# Patient Record
Sex: Female | Born: 1970
Health system: Western US, Academic
[De-identification: ages and names within clinical notes are randomized; demographics above are authoritative.]

## PROBLEM LIST (undated history)

## (undated) MED ORDER — SODIUM CHLORIDE FLUSH 0.9 % IV SOLN
10.0000 mL | Freq: Once | INTRAVENOUS | Status: AC
Start: 2019-09-29 — End: 2019-09-29

## (undated) MED ORDER — ACETAMINOPHEN 500 MG OR TABS
500.0000 mg | ORAL_TABLET | Freq: Once | ORAL | Status: AC
Start: 2019-09-18 — End: 2019-09-18

## (undated) MED ORDER — ATROPINE SULFATE 1 MG/10ML IJ SOSY
1.0000 mg | PREFILLED_SYRINGE | Freq: Once | INTRAMUSCULAR | Status: AC | PRN
Start: 2019-09-28 — End: 2020-03-26

## (undated) MED ORDER — DOBUTAMINE IN D5W 1 MG/ML IV SOLN
10.0000 ug/kg/min | Freq: Once | INTRAVENOUS | Status: AC | PRN
Start: 2019-09-28 — End: 2020-03-26

## (undated) MED ORDER — SODIUM CHLORIDE 0.9 % IV SOLN
2000.0000 mg | Freq: Once | INTRAVENOUS | Status: AC
Start: 2019-10-02 — End: 2019-10-02

## (undated) MED ORDER — SODIUM CHLORIDE FLUSH 0.9 % IV SOLN
10.0000 mL | Freq: Once | INTRAVENOUS | Status: AC
Start: 2019-09-28 — End: 2019-09-28

## (undated) MED ORDER — PLASMA-LYTE A IV SOLN
10.0000 mL/h | INTRAVENOUS | Status: AC
Start: 2019-09-18 — End: ?

## (undated) MED ORDER — POVIDONE-IODINE 10 % EX SWAB
1.0000 | Freq: Two times a day (BID) | CUTANEOUS | Status: AC
Start: 2019-08-13 — End: 2019-08-18

## (undated) MED ORDER — DEXAMETHASONE SODIUM PHOSPHATE 4 MG/ML IJ SOLN (CUSTOM)
4.0000 mg | Freq: Four times a day (QID) | INTRAMUSCULAR | Status: AC
Start: 2019-10-02 — End: ?

## (undated) MED ORDER — DEXTROSE-NACL 5-0.9 % IV SOLN (CUSTOM)
INTRAVENOUS | Status: AC
Start: 2019-08-13 — End: ?

## (undated) MED ORDER — PERFLUTREN LIPID MICROSPHERE 1.1 MG/ML IV SUSP
0.4000 mL | Freq: Once | INTRAVENOUS | Status: AC
Start: 2019-09-28 — End: 2019-09-28

---

## 2018-09-13 ENCOUNTER — Inpatient Hospital Stay: Admit: 2018-09-13 | Discharge: 2018-09-13 | Disposition: A | Discharge status: Home Health | Payer: Self-pay

## 2018-09-16 ENCOUNTER — Inpatient Hospital Stay: Admit: 2018-09-16 | Discharge: 2018-09-16 | Disposition: A | Discharge status: Home Health | Payer: Self-pay

## 2019-02-18 ENCOUNTER — Inpatient Hospital Stay: Admit: 2019-02-18 | Discharge: 2019-02-18 | Disposition: A | Discharge status: Home Health | Payer: Self-pay

## 2019-02-28 ENCOUNTER — Inpatient Hospital Stay: Admit: 2019-02-28 | Discharge: 2019-02-28 | Disposition: A | Discharge status: Home Health | Payer: Self-pay

## 2019-04-28 ENCOUNTER — Telehealth: Payer: PRIVATE HEALTH INSURANCE

## 2019-04-28 NOTE — Telephone Encounter
Tried calling pt re her appt with Dr. Earnest Bailey tomorrow; unable to leave vm. Wanted to make sure pt will bring in MRI CDs into appt and give reminder call

## 2019-04-29 ENCOUNTER — Inpatient Hospital Stay: Payer: PRIVATE HEALTH INSURANCE

## 2019-04-29 ENCOUNTER — Ambulatory Visit: Payer: PRIVATE HEALTH INSURANCE

## 2019-04-29 DIAGNOSIS — Z719 Counseling, unspecified: Secondary | ICD-10-CM

## 2019-04-29 DIAGNOSIS — G950 Syringomyelia and syringobulbia: Secondary | ICD-10-CM

## 2019-04-29 DIAGNOSIS — G935 Compression of brain: Secondary | ICD-10-CM

## 2019-04-29 NOTE — H&P
TITLE:   NEUROSURGICAL OUTPATIENT HISTORY AND PHYSICAL   DATE OF SERVICE:   04/29/2019   Referring Practitioner: Leana Roe., MD    ATTENDING PHYSICIAN:   Wynn Banker, MD     HISTORY OF PRESENT ILLNESS:   This is a 48 y.o.-year-old female with a history of craniectomy, partial C1 laminectomy, suboccipital, decompression on 09/01/15 at OSH for Chiari Malformation with syringomyelia.    Today the patient reports left hand numbness/tingling with weakness and left palm atrophy. She also reports left sided low back pain that extends into her left lateral thigh, calf, and into the bottom of her left foot with left leg weakness. Additionally, she reports all over body tingling. She further denies headaches, visual changes, difficulty swallowing, symptoms in the right upper or right lower extremity. She states her symptoms began in 2018 with no precipitating event. She states her symptoms are achy in nature which is better in the morning and becomes progressively worse throughout the day. She also reports improvement in symptoms when lying down and raising her feet. She endorses problems with fine motor control in the left hand (new difficulty using chopsticks), but denies problems with fine motor control in the right, difficulty with ambulation or bladder/bowel dysfunction. She takes gabapentin without significant relief. She has attempted physical therapy for 3 months earlier this year without significant relief. She additional reports attempted ESIx3 for her low back without significant relief (she is unsure of when these took place).     She was seen by her neurosurgery team in [place] who referred the patient to [facility] for second opinion regarding possible syringoperitoneal or syringoarachnoid shunt placement.    PAST MEDICAL HISTORY:   History reviewed. No pertinent past medical history.   PAST SURGICAL HISTORY:   Past Surgical History:   Procedure Laterality Date   ? head surgery        SOCIAL HISTORY: reports that she has never smoked. She has never used smokeless tobacco. She reports that she does not drink alcohol. No history on file for drug.   FAMILY HISTORY:   History reviewed. No pertinent family history.   CURRENT MEDICATIONS:   Medications that the patient states to be currently taking   Medication Sig   ? gabapentin 300 mg capsule       ALLERGIES:   Patient has no known allergies.   REVIEW OF SYSTEMS:   Fourteen systems are reviewed and noted in the patient's chart.   PHYSICAL EXAMINATION:   Vital Signs: BP 103/66  ~ Pulse 75  ~ Temp 36.2 ?C (97.1 ?F) (Temporal)  ~ Ht 5' 2'' (1.575 m)  ~ Wt 120 lb (54.4 kg)  ~ BMI 21.95 kg/m?   Neurologic Exam: Cranial nerves II through XII are intact.   Motor strength:  Motor D B T WE WF IO G   R 5 5 5 5 5 5 5    L 5 5 5 5 5  4+ 5     Motor HF KE KF DF PF EHL   R 5 5 5 5 5  4+   L 5 5 5 5 5 5      Sensory exam: intact and symmetric to light touch bilaterally with exception to decrease sensation in the left medial foot.     Deep tendon reflexes:     BR B T P A Hoffmann's Babinski   R 2 2 2  3+ 2 negative down   L 2 2 2  3+ 2 negative down   +suprapatellar  reflex bilaterally  No ankle clonus. Coordination is intact to rapid alternating finger movements. Gait is normal with no ataxia.     DIAGNOSTIC DATA:   MRI cervical spine:      MRI thoracic spine:      MRI lumbar spine:        ASSESSMENT:   This is a 48 y.o.-year-old female with a history of craniectomy, partial C1 laminectomy, suboccipital, decompression on 09/01/15 at OSH for Chiari Malformation with syringomyelia who presents today with left hand numbness/tingling with weakness and left palm atrophy. She also reports left sided low back pain that extends into her left lateral thigh, calf, and into the bottom of her left foot with left leg weakness. Additionally, she reports all over body tingling. Although in the absence of headaches, her imaging demonstrates persistent crowding at the cervicomedullary junction despite prior Chiari decompression which can be contributing to persistent syringomyelia. Given this, we recommend revision suboccipital craniectomy, C1 laminectomy (previously performed partial C1), shrinkage of cerebellar tonsils, and pericranial duraplasty but will require further work up of MRI brain without contrast to rule out brain pathology.     PLAN:   -MRI brain without contrast to further evaluate Chiari Malformation and rule out brain pathology  -We recommend revision suboccipital craniectomy, C1 laminectomy (previously performed partial C1), shrinkage of cerebellar tonsils, and pericranial duraplasty. Intraoperative and postoperative risks and benefits were explained to the patient in great detail including infection, bleeding, epidural or intradural hematoma, blood vessel injury causing stroke, imbalance, dysphagia or Horner's syndrome, failure to achieve the goal of the surgery, incomplete decompression, intraoperative or delayed CSF leak, spinal cord or nerve injury causing weakness, paralysis or neuropathic pain, chemical meningitis, pseudomeningocele, recurrent ectopia, and need for additional future surgery. Patient agreed to the discussed risks and would like to proceed with surgery.    I spent 60 minutes with this patient. More than 50 percent of this time was spent discussing the diagnosis and the treatment plan as well as counseling the patient. All questions were answered and Lauren Watson understood and was satisfied with this plan.   The patient was seen, examined, and plan formulated with Dr. Wynn Banker.   Benson Norway NP

## 2019-04-29 NOTE — Telephone Encounter
I spoke with pt regarding appointment with Dr. Earnest Bailey on Tuesday 04/29/19. I provided check in time, address, and parking info. I reminded pt to bring MRI disks for MRI in august and MRIs in February. She confirmed

## 2019-04-29 NOTE — Patient Instructions
Marland KitchenPLEASE REVIEW INFORMATION BELOW:     ??? For all visits, please be ready to show your photo ID and insurance card(s), and pay any applicable co-pays that will be due at the time of visit.     ??? It is your responsibility to be sure that your health insurance plan covers any lab test, radiology procedure, consultation, orthopaedic procedures which include, but not limit, injections, casting, bracing, durable medical equipment, etc. which has been recommended for you.  Contacting the Office:  ??? Each physician office has a Patient Care Coordinator that will help coordinate your care. They can help with:  ??? General questions  ??? Relay specific questions to the doctor   ??? Schedule surgery  ??? Follow up on authorizations that are pending  ??? Provide paperwork such as a return to work/school letter or disability forms  ??? Please allow 24 hours for messages to be returned from the physician???s office.  ??? There is a 7-10 business day turnaround for all forms.  ??? We encourage you to sign up for [facility] to communicate directly with the office or physician online. Please ask any staff member for additional information.   Following Up with the Physician:  ??? Before leaving, physician will let you know if you will need to follow-up within a certain time. If yes, make sure you schedule your follow-up appointment at the front desk or by calling the centralized scheduling center at:  ???  [phone] Orthopaedic Surgery.  ???  [phone] [facility].  ???  [phone] Orthopaedic/[facility].  ??? Did your physician order any labs, imaging, or tests that should be completed before your next follow-up? If yes, please continue reading below.  Physician Order/Pre-authorization:  ??? For any physician orders/pre-authorizations, please allow 5-7 business days for authorization to be obtained.  Imaging (MRI, X-Ray, CT Scan, etc)  ??? If you are scheduling at a [facility], please call Radiology at [phone] to schedule your imaging. You do not need to wait for an authorization to schedule.   ??? If you are scheduling outside of [place]:  ??? Notify your physician???s office of your location choice, this will help in obtaining authorization for the correct facility.  ??? Once you complete the imaging, obtain a copy of the report and images. Provide a copy to your physician for review.  ??? Schedule a follow-up appointment to review the images, unless stated otherwise by your physician or the office.  Injections  ??? If the physician recommends an injection, plan anywhere from 1-5 follow-up visits with your physician in a specific timeframe to complete the treatment plan. The follow-up visits can be as frequent as once a week until all the dosage has been administered.  ??? If authorization is required, allow 5-7 business days for the office to obtain authorization.  Lab work  ??? If labs are ordered, keep in mind:  ??? Fasting or non-fasting?  ??? How soon must they be done? (as soon as possible, one week before next appointment, a month before next appointment, etc.)  ??? Do you need to schedule a follow-up visit to discuss results or did the physician say they will call you with the results?   ??? If lab work is completed at a [facility], your results will automatically be sent to the physician, however allow a few days for results to be completed. Some labs require more time than others for results to finalize.  ??? If lab work is completed outside  a [facility], bring a copy of the lab orders with you. After a few days, follow-up with the lab to obtain a copy of your lab results for your physician.  Physical Therapy  ??? If physical therapy is recommended, notify the office of where you would prefer to seek therapy.  ??? Before doing so, verify with the physical therapy location that your insurance will be accepted.  ??? If authorization is required, allow 5-7 business days for the office to obtain authorization. ??? If physical therapy is completed at a [facility] facility, the progress reports will automatically be sent to the physician.  ??? Physical therapy requires multiple visits to your chosen facility, anywhere from 2 weeks and more depending on the nature of your case.  Nerve Conduction/EMG Study  ??? If the physician ordered a Nerve Conduction or EMG Study, contact the Refer to Physician Office to schedule your appointment.

## 2019-05-01 ENCOUNTER — Telehealth: Payer: PRIVATE HEALTH INSURANCE

## 2019-05-01 NOTE — Telephone Encounter
Called pt to discuss recommended surgery. She told me that she's busy working and will call me back.

## 2019-05-02 ENCOUNTER — Telehealth: Payer: PRIVATE HEALTH INSURANCE

## 2019-05-02 NOTE — Telephone Encounter
Pt called lmvm asking me to call her back, I called her, but got vm.

## 2019-05-03 NOTE — Telephone Encounter
Spoke to pt re surgery, we discussed oct dates, but after speaking to her family and pcp, she would like mid nov, I emailed dr Earnest Bailey to check dates.     Emailed pt all surgery info, she is aware she will need a new mri.

## 2019-05-05 ENCOUNTER — Inpatient Hospital Stay: Payer: PRIVATE HEALTH INSURANCE

## 2019-05-05 ENCOUNTER — Telehealth: Payer: PRIVATE HEALTH INSURANCE

## 2019-05-05 ENCOUNTER — Ambulatory Visit: Payer: PRIVATE HEALTH INSURANCE

## 2019-05-05 DIAGNOSIS — G935 Compression of brain: Secondary | ICD-10-CM

## 2019-05-05 NOTE — Telephone Encounter
Emailed pt the MRI info

## 2019-05-05 NOTE — Telephone Encounter
Spoke to pt and her friend, she confirmed surgery for 06/02/19, emailed info, she decided that she would like to have the MRI at [facility].

## 2019-05-06 ENCOUNTER — Ambulatory Visit: Payer: PRIVATE HEALTH INSURANCE

## 2019-05-06 ENCOUNTER — Telehealth: Payer: PRIVATE HEALTH INSURANCE

## 2019-05-06 DIAGNOSIS — Z01818 Encounter for other preprocedural examination: Secondary | ICD-10-CM

## 2019-05-06 NOTE — Telephone Encounter
Faxed pcp office the pre-op form, called their office as well

## 2019-05-06 NOTE — Telephone Encounter
Spoke to [place] at pt's PCP office, faxed her an h/p form for the pre-op.     She said the pt wants to have her MRI near her home, not [place]. I sent the pt another email requesting where she wants to have the MRI again.

## 2019-05-08 ENCOUNTER — Inpatient Hospital Stay: Admit: 2019-05-08 | Discharge: 2019-05-08 | Disposition: A | Discharge status: Home Health | Payer: Self-pay

## 2019-05-09 ENCOUNTER — Ambulatory Visit: Payer: PRIVATE HEALTH INSURANCE

## 2019-05-13 ENCOUNTER — Ambulatory Visit: Payer: PRIVATE HEALTH INSURANCE

## 2019-05-15 ENCOUNTER — Inpatient Hospital Stay: Payer: PRIVATE HEALTH INSURANCE

## 2019-05-15 DIAGNOSIS — R6889 Other general symptoms and signs: Secondary | ICD-10-CM

## 2019-05-22 ENCOUNTER — Telehealth: Payer: PRIVATE HEALTH INSURANCE

## 2019-05-22 ENCOUNTER — Ambulatory Visit: Payer: PRIVATE HEALTH INSURANCE

## 2019-05-22 NOTE — Telephone Encounter
Called pt re new orders from Dr. Earnest Bailey:    Please see if she can be seen by ophthalmology - history of Chairi surgery and mild hydrocephalus - rule out papilledema.  Also please order a brain MR venogram - rule out venous obstruction.  We need both of these things next week.     She asked me to email her this information. I've also asked Dr Earnest Bailey to clarify the contrast of the venogram.

## 2019-05-22 NOTE — Telephone Encounter
Called pcp office to request pre-op clearance, but they are closed, no vm.     Emailed pt asking her to please have clearance faxed.

## 2019-05-23 ENCOUNTER — Telehealth: Payer: PRIVATE HEALTH INSURANCE

## 2019-05-23 NOTE — Telephone Encounter
I called PCPs office @ [phone] re ophthalmology referral but got the operator because the office doesnt open until 9:30am today. I asked operator to pls send them a msg to call me back ASAP. I will also call them back @ 9:30am

## 2019-05-23 NOTE — Telephone Encounter
I spoke w/Dr. Leland Her office. She stated that they received auth via fax from Dr. Lamont Snowball office but not fax that I sent over.    I refaxed to them    Dr. Donnita Falls   Fax [phone]; tel: [phone]

## 2019-05-23 NOTE — Telephone Encounter
Faxed Dr. Ray Church clinic notes and ophthalmology auth from PCP to Dr. Mina Marble. Pt has appt w/ him on 11/3 @ 9:45am.    Dr. Donnita Falls   Fax [phone]; tel: [phone]  Address: [address], [place] [address]

## 2019-05-23 NOTE — Telephone Encounter
Spoke with Mardene Celeste in Dr. Jacki Cones office and scheduled ophthalmology appt for pt on  11/3 @ 9:45am. Spoke with pt and she confirm this appt.     Spoke w/ Tina @ PCP's office [phone]). She will submit urgent auth to ophthalmology.    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 She will call and schedule MR venogram with [facility]. I have already submitted an urgent auth. She understands that she must schedule ASAP because surgery is on 06/02/19

## 2019-05-26 ENCOUNTER — Inpatient Hospital Stay: Admit: 2019-05-26 | Discharge: 2019-05-26 | Disposition: A | Discharge status: Home Health | Payer: Self-pay

## 2019-05-26 ENCOUNTER — Telehealth: Payer: PRIVATE HEALTH INSURANCE

## 2019-05-26 NOTE — Telephone Encounter
Spoke w/ pt. She's scheduled to have venogram @ [facility] today @ 5pm. She will drop cd off tomorrow @ spine center. I emailed her spine center address and venogram order. Also faxed copy to [facility]. Also reminded her of her appt w/Dr. Mina Marble tomorrow

## 2019-05-26 NOTE — Telephone Encounter
Spoke w/ Alfonse Spruce in Dr. Leland Her office re pt's appt tomorrow with Dr. Mina Marble. She confirmed they received clinic notes form Dr.Holly and I explained to her why pt is coming and I told her to pls pass info on to Dr. Mina Marble. She confirmed she will do this and they will fax [place] Dr. Josefine Class clinic notes once she has been seen.       Dr. Donnita Falls   Fax [phone]; tel: [phone]

## 2019-05-27 ENCOUNTER — Telehealth: Payer: PRIVATE HEALTH INSURANCE

## 2019-05-27 NOTE — Telephone Encounter
Spoke with Dr. Annye Asa Dang's office @     Phone: [phone] - they are having pt come back tomorrow for a test, then will send results.

## 2019-05-28 ENCOUNTER — Telehealth: Payer: PRIVATE HEALTH INSURANCE

## 2019-05-28 NOTE — Telephone Encounter
Called ins company and pt re LOA.     He request is still pending.

## 2019-05-29 ENCOUNTER — Telehealth: Payer: PRIVATE HEALTH INSURANCE

## 2019-05-29 NOTE — Telephone Encounter
Ive spoken and emailed the pt, she is aware that her ins company will not approve an LOA and they have redirected her to [address]. She understands that we have cxled the surgery and all appointments.

## 2019-05-29 NOTE — Telephone Encounter
i've spoken with the pt, Lauren Watson and [name] in provider relations at her medical group many times today re the LOA request.     Per Butch Penny, she will not request an LOA for this pt, they will redirect the auth to [facility].     Notified pt.

## 2019-05-31 ENCOUNTER — Ambulatory Visit: Payer: PRIVATE HEALTH INSURANCE

## 2019-06-17 ENCOUNTER — Ambulatory Visit: Payer: PRIVATE HEALTH INSURANCE

## 2019-07-01 ENCOUNTER — Telehealth: Payer: PRIVATE HEALTH INSURANCE

## 2019-07-01 NOTE — Telephone Encounter
Received call from [facility] last Thursday. She was inquiring about obtaining records from Dr. [facility] office based on a referral he gave to [facility]. I had explained to her that the referral was not from Dr. Earnest Bailey and I would call pt's ins to further investigate because ins did not approve surgery with Dr. Earnest Bailey and re-directed her to [facility].     I tried calling pts family choice @ [phone], per Joana, however, no answer, unable to lmvm.     I called Di Kindle back @ [phone] and left a msg with her officemate, Blanch Media to have joanna pls call me back.     I have not heard back so I called back again, unable to leave a vm.

## 2019-07-01 NOTE — Telephone Encounter
Di Kindle from [place] returned my call. I reiterated that We didnt refer pt to [facility], her ins company re-directed her there. They are the ones assigning the doctor and referring her, not Dr. Earnest Bailey. She understood and said she will make note. I faxed pt's records to [facility] @ Fax #: [phone]    Tel: [phone]

## 2019-07-28 ENCOUNTER — Encounter: Payer: Self-pay | Admitting: Neurological Surgery

## 2019-07-28 ENCOUNTER — Ambulatory Visit: Payer: No Typology Code available for payment source | Attending: Neurological Surgery | Admitting: Neurological Surgery

## 2019-07-28 ENCOUNTER — Other Ambulatory Visit: Payer: Self-pay | Admitting: Neurological Surgery

## 2019-07-28 VITALS — BP 137/66 | HR 87 | Temp 97.9°F | Resp 18 | Ht 62.0 in | Wt 122.9 lb

## 2019-07-28 DIAGNOSIS — G935 Compression of brain: Secondary | ICD-10-CM | POA: Insufficient documentation

## 2019-07-28 DIAGNOSIS — R1900 Intra-abdominal and pelvic swelling, mass and lump, unspecified site: Secondary | ICD-10-CM | POA: Insufficient documentation

## 2019-07-28 DIAGNOSIS — Z6822 Body mass index (BMI) 22.0-22.9, adult: Secondary | ICD-10-CM | POA: Insufficient documentation

## 2019-07-28 NOTE — Progress Notes (Signed)
NEUROSURGERY NEW VISIT     Visit Type: New Visit    Requesting Provider: Ronda Fairly    Reason For Visit: had concerns including New Patient.    History Of Present Illness: 49 year old female with a past medical history of a chiari malformation s/p suboccipital decompression (2017; [facility]) presenting today to establish care for a subsequent suboccipital decompression with C1 laminectomy. The patient had been followed and worked up at [facility] for this procedure with Dr. Jeanice Lim until insurance stated that she needed to have the surgery performed at [facility]. 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 The patient's symptoms initially presented with complete weakness of her lower extremities in 2015 which identified her syrinx and chiari which occurred in [place]. She had improvement in her lower extremity and was able to regain her strength. In 2017, she had the suboccipital decompression performed which left her was some left leg weakness that improved over the course of a year with the use of a stationary bike. Currently, her symptoms include left plantar pain as well as generalized body numbness worse at night and better in the morning. In addition, she endorsees left handed weakness and intermittent headaches that are severe but resolve.The patient denies nausea, vomiting, and balance difficulties. Her prior surgeries include the suboccipital decompression.       [place] vietnamese interpreter [phone]) was used for the entirety of the visit as well as physical examination.    Allergies: No Known Allergies    Medications:   No current outpatient medications on file.     No current facility-administered medications for this visit.        Past Medical History:  has no past medical history on file.    Past Surgical History:  has no past surgical history on file.    Social History:  reports that she has never smoked. She has never used smokeless  tobacco.    Family History: family history is not on file.    Review of Systems:  A 14 point review of systems was conducted with pertinent positives included in the HPI, otherwise negative.    General: No acute distress  HEENT: Normocephalic, atraumatic  Neck: supple; suboccipital incision appears clean, dry and intact  CV: normal rate  Lungs: No labored respirations  Abdomen: Non-distended   Extremities: No deformity noted   Skin: No rashes or skin lesions   Back: No deformity noted, no point tenderness     Pupillometry:    Left Pupil:    Right Pupil:                      Awake, alert, oriented, speech fluent  PERRL, EOMI, facial sensation equal bilaterally, face symetric at rest and on excursion, hearing grossly intact, voice normal, shoulder shrug equal, tongue protrudes to midline   Shoulder Deltoid Biceps Triceps Grip Interossei HipFlex HipExt LegFlex LegExt DorsiFlex PlantarFlex   L 5 5 4+ 4+ 4 4 5 5 5 5 5 5    R 5 5 5 5 5 5 5 5 5 5 5 5    Sensation intact to light touch throughout  Reflexes 3+ bilaterally biceps, brachioradialis, patella;   No Hoffman; +Froment's sign (left)    IMAGING STUDIES: Films ordered and viewed today show a suboccipital decompression with C1 still intact. A syrinx is noted in the cervical, thoracic, and lumbar spine.      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 Neurologically, the patient is stable and intact.    PLAN/DISCUSSION: The plan is for CT head w/o contrast to include C1 for surgical planning and follow up with Dr. Rigoberto Noel. All questions were answered and Thi Gurneet TYRHONDA GEORGIADES understood and was satisfied with this plan. Thank you for allowing [place] to participate in the care of Thi Kendalynn MAEVE DEBORD.    FOLLOWUP: Return to clinic 2 weeks to follow up with Dr. Rigoberto Noel. The patient is encouraged to call us with any questions or problems in the interim. Our contact  numbers were given to the patient.      Haywood Filler, MD

## 2019-07-31 ENCOUNTER — Other Ambulatory Visit: Payer: Self-pay

## 2019-08-13 ENCOUNTER — Encounter: Payer: Self-pay | Admitting: Neurological Surgery

## 2019-08-13 ENCOUNTER — Ambulatory Visit: Payer: No Typology Code available for payment source | Attending: Neurological Surgery | Admitting: Neurological Surgery

## 2019-08-13 VITALS — BP 137/50 | HR 88 | Temp 98.6°F | Resp 18 | Ht 62.0 in | Wt 123.0 lb

## 2019-08-13 DIAGNOSIS — G935 Compression of brain: Secondary | ICD-10-CM

## 2019-08-13 DIAGNOSIS — Z6822 Body mass index (BMI) 22.0-22.9, adult: Secondary | ICD-10-CM | POA: Insufficient documentation

## 2019-08-13 NOTE — Progress Notes (Signed)
Josem Kaufmann, MD  [facility]  Department of Neurological Surgery  [address]  [place], [place] [address]   [email or web address]  New Referrals: [phone] Fax: [phone]  Follow Up Apt: [phone]    History and Physical                                    Date of Evaluation: 08/13/2019    Chief Complaint and History of Present Illness:  Chief Complaint   Patient presents with   . Consultation     HPI: 49 year old female with a past medical history of a chiari malformation s/p suboccipital decompression (2017; [place]) presenting today to establish care for a subsequent suboccipital decompression with C1 laminectomy. The patient had been followed and worked up at [facility] for this procedure with Dr. Earnest Bailey until insurance stated that she needed to have the surgery performed at [facility]. 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 The patient's symptoms initially presented with complete weakness of her lower extremities in 2015 which identified her syrinx and chiari which occurred in [place]. She had improvement in her lower extremity and was able to regain her strength. In 2017, she had the suboccipital decompression performed which left her was some left leg weakness that improved over the course of a year with the use of a stationary bike. Currently, her symptoms include left plantar pain as well as generalized body numbness worse at night and better in the morning. In addition, she endorsees left handed weakness and intermittent headaches that are severe but resolve.The patient denies nausea, vomiting, and balance difficulties. These findings have been stable since she was seen in Dr. Myrna Blazer clinic. Her prior surgeries include the suboccipital decompression.      Past Medical History:  No past medical history on file.   Surgical History:   No past surgical history on file.  Social  History:  Tobacco:   reports that she has never smoked. She has never used smokeless tobacco.  Alcohol:   has no history on file for alcohol.  Substance Abuse:  has no history on file for drug.    Family History:  No family history on file.  Allergies:   Patient has no known allergies.    Current Medications:  No current outpatient medications on file.     No current facility-administered medications for this visit.      Review of Systems: 12 point Review of Systems is negative other than what is noted above.     Physical Exam:  Vital Signs:   height is 5\' 2"  (1.575 m) and weight is 55.8 kg (123 lb). Her temporal temperature is 98.6 F (37 C). Her blood pressure is 137/50 and her pulse is 88. Her respiration is 18. , Body mass index is 22.5 kg/m.  Review of Systems:  A 14 point review of systems was conducted with pertinent positives included in the HPI, otherwise negative.    General: No acute distress  HEENT: Normocephalic, atraumatic  Neck: supple; suboccipital incision appears clean, dry and intact  CV: normal rate  Lungs: No labored respirations  Abdomen: Non-distended   Extremities: No deformity noted   Skin: No rashes or skin lesions   Back: No deformity noted, no point tenderness     Pupillometry:  Left Pupil:                                            Right Pupil:                                                                                     Awake, alert, oriented, speech fluent  PERRL, EOMI, facial sensation equal bilaterally, face symetric at rest and on excursion, hearing grossly intact, voice normal, shoulder shrug equal, tongue protrudes to midline   Shoulder Deltoid Biceps Triceps Grip Interossei HipFlex HipExt LegFlex LegExt DorsiFlex PlantarFlex   L 5 5 4+ 4+ 4 4 5 5 5 5 5 5    R 5 5 5 5 5 5 5 5 5 5 5 5    Sensation intact to light touch throughout  Reflexes 3+ bilaterally biceps, brachioradialis, patella;   No Hoffman; +Froment's sign (left)    Special Exam: none performed    Diagnostic  Tests/Imaging:  Films ordered and viewed today show a suboccipital decompression with C1 still intact. A syrinx is noted in the cervical, thoracic, and lumbar spine.    Assessment:   49 year old female with a past medical history of a chiari malformation s/p suboccipital decompression (2017; [place]) presenting today to establish care for a subsequent suboccipital decompression with C1 laminectomy.  The patient has continued LUE weakness and a significant syrinx that extends from the cervical spine down to the lumbar spine. The patient was explained the risks and benefits of the surgery. She would like to proceed with surgery.    Recommendations/Plans:  - Patient was consented in clinic using a [place] interpretor.   - Patient would like her procedure to be scheduled after Feb as she will be a part of a new medical group  - A pelvic cyst was appreciated on her MRI L spine. A CT Pelvis was ordered for further evaluation (Ventral to S1)    05-13-2005. Mar, MD,MS   Resident Physician, PGY1  Department of Neurological Surgery   Neurosurgery Floor Pager: [phone]  Consult Pager: [phone]

## 2019-08-15 ENCOUNTER — Other Ambulatory Visit: Payer: Self-pay

## 2019-08-15 LAB — EMMI , PATIENT SATISFACTION: HOSPITAL VISIT EXPECTATIONS: EMMI Video Order Number: 11233314134

## 2019-08-15 LAB — EMMI , ANESTHESIA (ADULT): EMMI Video Order Number: 12819458436

## 2019-08-26 ENCOUNTER — Ambulatory Visit: Payer: PRIVATE HEALTH INSURANCE

## 2019-09-02 ENCOUNTER — Telehealth: Payer: Self-pay | Admitting: Physician Assistant

## 2019-09-02 NOTE — Telephone Encounter (Signed)
Left message for [name] to call back to review pre op medication instructions.

## 2019-09-10 ENCOUNTER — Other Ambulatory Visit
Admission: RE | Admit: 2019-09-10 | Discharge: 2019-09-10 | Disposition: A | Discharge status: Home / Self Care | Payer: No Typology Code available for payment source | Source: Ambulatory Visit | Attending: Physician Assistant | Admitting: Physician Assistant

## 2019-09-10 DIAGNOSIS — G935 Compression of brain: Secondary | ICD-10-CM | POA: Insufficient documentation

## 2019-09-10 LAB — CBC WITH DIFF, BLOOD
ANC automated: 2.7 10*3/uL (ref 2.0–8.1)
Basophils %: 0.7 %
Basophils Absolute: 0 10*3/uL (ref 0.0–0.2)
Eosinophils %: 1.7 %
Eosinophils Absolute: 0.1 10*3/uL (ref 0.0–0.5)
Hematocrit: 39.8 % (ref 34.0–44.0)
Hgb: 13.4 G/DL (ref 11.5–15.0)
Lymphocytes %: 34 %
Lymphocytes Absolute: 1.6 10*3/uL (ref 0.9–3.3)
MCH: 28.7 PG (ref 27.0–33.5)
MCHC: 33.6 G/DL (ref 32.0–35.5)
MCV: 85.4 FL (ref 81.5–97.0)
MPV: 8.3 FL (ref 7.2–11.7)
Monocytes %: 8.3 %
Monocytes Absolute: 0.4 10*3/uL (ref 0.0–0.8)
Neutrophils % (A): 55.3 %
PLT Count: 171 10*3/uL (ref 150–400)
RBC: 4.66 10*6/uL (ref 3.70–5.00)
RDW-CV: 13.7 % (ref 11.6–14.4)
White Bld Cell Count: 4.8 10*3/uL (ref 4.0–10.5)

## 2019-09-10 LAB — BASIC METABOLIC PANEL, BLOOD
BUN: 9 mg/dL (ref 7–25)
CO2: 29 mmol/L (ref 21–31)
Calcium: 9.1 mg/dL (ref 8.6–10.3)
Chloride: 102 mmol/L (ref 98–107)
Creat: 0.4 mg/dL — ABNORMAL LOW (ref 0.6–1.2)
Electrolyte Balance: 5 mmol/L (ref 2–12)
Glucose: 97 mg/dL (ref 70–115)
Potassium: 4.4 mmol/L (ref 3.5–5.1)
Sodium: 136 mmol/L (ref 136–145)
eGFR - high estimate: >60 (ref >59)
eGFR - low estimate: >60 (ref >59)

## 2019-09-10 LAB — PT/INR/PTT
INR: 0.99 (ref 0.91–1.10)
PTT: 28.9 s (ref 23.9–35.9)
Prothrombin Time: 12.5 s (ref 11.7–13.6)

## 2019-09-18 ENCOUNTER — Ambulatory Visit: Payer: No Typology Code available for payment source | Admitting: Nurse Practitioner

## 2019-09-18 NOTE — Anesthesia Preprocedure Evaluation (Addendum)
ANESTHESIA PRE-OPERATIVE EVALUATION    Patient Information    Name: [name]    MRN: [id number]    DOB: May 14, 1971    Age: 49 year old    Sex: female  Procedure(s):  T3 AM ICU - Revision of prior suboccipital decompression for chiari malformation      Pre-op Vitals:   There were no vitals taken for this visit.        Primary language spoken:  [place]    ROS/Medical History:       History of Present Illness: 45F with chiari malformation     Temperature: 36.8 C (03/11 0619)  Blood pressure (BP): 112/64 (03/11 0619)  Heart Rate: 78 (03/11 0619)  Respirations: 16 (03/11 0619)  MAP (mmHg): 82 (03/11 0619)  SpO2: 100 % (03/11 0619)      Special Considerations: COVID not detected 09/30/19    General:  BMI22.5  Able to do strenuous exercise per ePAT.  Negative social hx   Cardiovascular:    Referred to [facility] for preop eval, CP, palpitation and DOE.    Card consult 09/29/19 Jill Alexanders MD:    Preop cardiovascular exam  RCRI 1 for surgery of intermediate risk. No complication from prior cranial surgery in 2017. No h/o MI, DM, CVA, CKD, or other acute uncontrolled severe CV symptoms or conditions. Exam benign and ECG wnl in clinic 09/29/2019. Coronary artery revascularization before elective vascular surgery does not significantly alter the long-term outcome.   - TTE to assess for resting LV function and rule out sig VHD. If these are absent, no further cardiac w/u prior to surgery    # CP  Atypical   - echo  - consider TM echo - this does not preclude surgery    # rapid/irregular heart beat  - 2-week ambulatory event monitor   - echo    EKG 09/29/19 (media): SR @ 77 with sinus arrythmia    Echo 09/30/19 (epic):  Summary:  1. Normal left ventricular systolic function. The left ventricular ejection fraction is 63% by Biplane.  2. Normal LV filling pressures.  3. Normal diastolic LV function.  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  Able to walk w/o difficult (no falls, no assist device) Hematology/Oncology:     HX of blood tx    GI/Hepatic:  negative GI/hepatic ROSno GERD,  no liver disease,   Infectious Disease:  negative for infectious disease     Renal:  negative renal ROS   Endocrine/Other:  negative endo/other ROS  no diabetes,  no steriod use,     Pregnancy History:  Currently pregnant: no,  Hcg<5 DOS Pediatrics:         Pre Anesthesia Testing (PCC/CPC) notes/comments:    [facility] Test & records reviewed by [facility] Provider.                      Phone call done with Leland Her interpreter # [phone] 09/18/2019 3:36 PM.  Labs 09/10/19 reviewed  and ok.  Tylenol '500mg'$  preop    Evaluated by [facility] 09/29/19 with echo, per Card if echo normal, no further cardiac w/u prior to surgery. Echo done 09/30/19 EF 63%, no valvular abnormalities.  Ready Reviewed by Jerilynn Mages.Schlierkamp NP 09/30/2019 1:33 PM                       Physical Exam    Airway:  Inter-inciser distance > 4 cm    Mallampati: II  Neck ROM: full  TM distance: 5-6 cm  Short thick neck: No        Cardiovascular:  - cardiovascular exam normal         Pulmonary:  - pulmonary exam normal           Neuro/Neck/Skeletal/Skin:      Positive for Neuro abnormalities      Dental:    Comment: Intact. Nothing loose/removable.  Upper front teeth appear chipped.      Abdominal:      General: normal weight     Additional Clinical Notes:                          No past medical history on file.  No past surgical history on file.  Social History     Tobacco Use   . Smoking status: Never Smoker   . Smokeless tobacco: Never  Used   Substance Use Topics   . Alcohol use: Not on file   . Drug use: Not on file       No current outpatient medications on file.     No current facility-administered medications for this visit.      No Known Allergies    Labs and Other Data  Lab Results   Component Value Date    SODIUM 136 09/10/2019    K 4.4 09/10/2019    CL 102 09/10/2019    BUN 9 09/10/2019    CREAT 0.4 (L) 09/10/2019    GLU 97 09/10/2019    Kemp 9.1 09/10/2019     No results found for: AST, ALT, GGT, LDH, ALK, TP, ALB, TBILI, DBILI  Lab Results   Component Value Date    RBC 4.66 09/10/2019    HGB 13.4 09/10/2019    HCT 39.8 09/10/2019    MCV 85.4 09/10/2019    MCHC 33.6 09/10/2019    RDW 13.7 09/10/2019    PLT 171 09/10/2019    MPVH 8.3 09/10/2019     Lab Results   Component Value Date    INR 0.99 09/10/2019    PTT 28.9 09/10/2019     No results found for: ARTPH, ARTPO2, ARTPCO2    Anesthesia Plan:  Risks and Benefits of Anesthesia  I personally examined the patient immediately prior to the anesthetic and reviewed the pertinent medical history, drug and allergy history, laboratory and imaging studies and consultations. I have determined that the patient has had adequate assessment and testing.    Anesthetic techniques, invasive monitors, anesthetic drugs for induction, maintenance and post-operative analgesia, risks and alternatives have been explained to the patient and/or patient's representatives.    I have prescribed the anesthetic plan:         Planned anesthesia method: General         ASA 3 (Severe systemic disease)     Potential anesthesia problems identified and risks including but not limited to the following were discussed with patient and/or patient's representative: Adverse  or allergic drug reaction, Recall, Ocular injury, Nerve injury, Dental injury or sore throat and Injury to brain, heart and other organs    No Beta Blocker Indicated: Patient not on beta blockersPlanned monitoring method: Routine monitoring and Arterial line  monitoring  Comments: (Unable to get [place] interpreter by phone.  Used [place] Multimedia programmer to help with anesthesia consent.  NPO>MN.  No home meds.  Plan GETA, std ASA monitors, A-line, TIVA, T+S.  R/b/o d/w pt, questions/concerns addressed prior to consent.)    Informed Consent:  Interpreter used: Present in person  Anesthetic plan and risks discussed with Patient.  Use of blood products discussed with who consented to blood products.         Plan discussed with CRNA.

## 2019-09-19 ENCOUNTER — Telehealth: Payer: Self-pay

## 2019-09-19 DIAGNOSIS — G935 Compression of brain: Secondary | ICD-10-CM

## 2019-09-19 NOTE — Telephone Encounter (Signed)
Received call from the patient's surgery scheduler that it is now being recommended the patient have a cardiac clearance prior to surgery on 10/02/19.  A message from [facility] mentions that the patient reported having episodes of chest pain/squeezing sensation, dyspnea on exertion, and possible irregular heart rate.  The conversation took place with use of a [place] speaking interpreter.  There was some difficulty with the conversation, but the concerning symptoms do warrant an evaluation with a cardiologist.    Order has been placed and awaiting signature by physician assistant.

## 2019-09-22 ENCOUNTER — Telehealth: Payer: Self-pay | Admitting: Physician Assistant

## 2019-09-22 NOTE — Telephone Encounter (Signed)
Patient needs a cardiac clearance for an upcoming neurosurgery scheduled for 10/02/2019.   Please review and if possible schedule the patient as I was unable to find her an app in the system.  Please call Victorino Dike at [phone].    Thank you!

## 2019-09-22 NOTE — Telephone Encounter (Signed)
For the MAs to help in scheduling.

## 2019-09-22 NOTE — Telephone Encounter (Signed)
SCHEDULED FOR 09/29/19 WITH DR Reina Fuse

## 2019-09-29 ENCOUNTER — Ambulatory Visit
Payer: No Typology Code available for payment source | Attending: Cardiovascular Disease | Admitting: Cardiovascular Disease

## 2019-09-29 ENCOUNTER — Encounter: Payer: Self-pay | Admitting: Cardiovascular Disease

## 2019-09-29 VITALS — BP 117/72 | HR 90 | Temp 97.8°F | Resp 16 | Wt 123.6 lb

## 2019-09-29 DIAGNOSIS — R0600 Dyspnea, unspecified: Secondary | ICD-10-CM | POA: Insufficient documentation

## 2019-09-29 DIAGNOSIS — R072 Precordial pain: Secondary | ICD-10-CM | POA: Insufficient documentation

## 2019-09-29 DIAGNOSIS — R0609 Other forms of dyspnea: Secondary | ICD-10-CM

## 2019-09-29 DIAGNOSIS — R002 Palpitations: Secondary | ICD-10-CM | POA: Insufficient documentation

## 2019-09-29 DIAGNOSIS — Z6822 Body mass index (BMI) 22.0-22.9, adult: Secondary | ICD-10-CM | POA: Insufficient documentation

## 2019-09-29 DIAGNOSIS — Z01810 Encounter for preprocedural cardiovascular examination: Secondary | ICD-10-CM | POA: Insufficient documentation

## 2019-09-29 MED ORDER — SODIUM CHLORIDE 0.9% TKO INFUSION
Freq: Once | INTRAVENOUS | Status: DC
Start: 2019-09-29 — End: 2019-10-04

## 2019-09-29 MED ORDER — PERFLUTREN LIPID MICROSPHERE 1.1 MG/ML IV SUSP
0.4000 mL | Freq: Once | INTRAVENOUS | Status: AC
Start: 2019-09-29 — End: ?

## 2019-09-29 NOTE — Patient Instructions (Signed)
COMPLETE  ECHO @ [place] location in :  STAT ( Need authorization, we will call you )           Appointment date/time: __________________________    WHAT IS AN ECHOCARDIOGRAM or HEART ULTRASOUND?  It is a test that uses sound waves to look at the heart. It is a safe and painless test.     IS THERE SPECIAL PREPARATION?    There is no preparation. You may eat, drink and take your regular medications unless you are told otherwise by your doctor.   You will be more comfortable if you wear a two piece outfit.     HOW IS THE TEST DONE?  You will be asked to disrobe from the waist up. A patient gown will be given to you to provide for your privacy. You will lie down on a hospital bed or an exam table. Small electrodes are placed on your chest to monitor you heartbeat. A colorless, cool gel is put on your chest. The sonographer moves the transducer over your chest and abdomen to obtain the views. The sonographer will ask you to change positions. He/she may ask you to hold your breath or exhale for a few seconds (the air in your lungs can affect the echo images). There is a possibility that you will need an echo enhancement agent to obtain a more diagnostic image of your heart. This agent will be injected through an IV that is inserted in your arm. The IV will be disconnected as soon as the test is complete. There are no lasting effects from the agent.     WHAT IF THE PATIENT IS A CHILD?  We need the child to lie still and be quiet during the test. We have TV & DVD player and some DVDs for the child to watch. If your child has a favorite movie, please bring it with you.    WHY DO I NEED THIS TEST?  This test shows the doctor how your heart is working, the size of your heart and its chambers, how your valves are working and many other things.     HOW LONG DOES IT TAKE?  The entire exam usually takes 45-60 minutes. Be sure to allow enough time to check in.   After the test, you may return to your normal activities.     WHEN  WILL I GET THE RESULTS?  The exam is read by the cardiologist and sent to the electronic medical record within 48 hours.    COMPLETE EXERCISE ECHO @ [place] location in :  BEFORE the follow up.           Appointment date/time: __________________________    WHAT IS AN ECHOCARDIOGRAM or HEART ULTRASOUND?  It is a test that uses sound waves to look at the heart. It is a safe and painless test.     IS THERE SPECIAL PREPARATION?    There is no preparation. You may eat, drink and take your regular medications unless you are told otherwise by your doctor.   You will be more comfortable if you wear a two piece outfit.     HOW IS THE TEST DONE?  You will be asked to disrobe from the waist up. A patient gown will be given to you to provide for your privacy. You will lie down on a hospital bed or an exam table. Small electrodes are placed on your chest to monitor you heartbeat. A colorless, cool gel is put on your chest.  The sonographer moves the transducer over your chest and abdomen to obtain the views. The sonographer will ask you to change positions. He/she may ask you to hold your breath or exhale for a few seconds (the air in your lungs can affect the echo images). There is a possibility that you will need an echo enhancement agent to obtain a more diagnostic image of your heart. This agent will be injected through an IV that is inserted in your arm. The IV will be disconnected as soon as the test is complete. There are no lasting effects from the agent.     WHAT IF THE PATIENT IS A CHILD?  We need the child to lie still and be quiet during the test. We have TV & DVD player and some DVDs for the child to watch. If your child has a favorite movie, please bring it with you.    WHY DO I NEED THIS TEST?  This test shows the doctor how your heart is working, the size of your heart and its chambers, how your valves are working and many other things.     HOW LONG DOES IT TAKE?  The entire exam usually takes 45-60 minutes. Be  sure to allow enough time to check in.   After the test, you may return to your normal activities.     WHEN WILL I GET THE RESULTS?  The exam is read by the cardiologist and sent to the electronic medical record within 48 hours.

## 2019-09-29 NOTE — H&P (Signed)
CARDIOLOGY CLINIC - New Patient    Patient Name: [name]    Patient Medical Record #: [id number]    Date of Service: 09/29/2019    Primary Care Physician: Alonna Minium, MD     Referring Physician: Stephannie Li    Reason for Visit:   Chief Complaint   Patient presents with   . New Patient   . Pre-Op Exam      History of Present Illness:  Dear Dwaine Deter and Dr. Cyndie Chime,  I had the pleasure of meeting your patient, [name], who as you know is a  49 year old female with no significant cardiac history presents for preop eval, CP, palpitation and DOE    09/29/2019  - history of a chiari malformation s/p suboccipital decompression (2017; [place]) now with planned suboccipital decompression with C1 laminectomy. The patient had been followed and worked up at [facility]. The patient's symptoms initially presented with complete weakness of her lower extremities in 2015 which identified her syrinx and chiari which occurred in [place]. She had improvement in her lower extremity and was able to regain her strength. In 2017, she had the suboccipital decompression performed which left her was some left leg weakness that improved over the course of a year with the use of a stationary bike. Currently, her symptoms include left plantar pain as well as generalized body numbness worse at night and better in the morning. In addition, she endorsees left handed weakness and intermittent headaches that are severe but resolve.The patient denies nausea, vomiting, and balance difficulties. These findings have been stable since she was seen in Dr. Nigel Bridgeman clinic. Her prior surgeries include the suboccipital decompression. At anesthesia eval, she was noted to report CP, SOB, palpitation  - she endorses the squeezing type of precordial pain and occ palpitation. Physically active. No PND/orthopnea/leg swelling/presyncope/syncope/h/o RHD    Past Medical History:  No past medical history on file.  Patient Active Problem List    Diagnosis   . Chiari malformation type I (CMS-HCC)     No past surgical history on file.    Medications:  No current outpatient medications on file.     Allergies & Adverse Reactions:  Patient has no known allergies.    Family History:  No family history on file.    Social History:  Tobacco: Never   Alcohol: no  Illicit drugs: no  Caffeinated drinks: 1 cup(s) of coffee/day   Other: from [place] and came to [place] at age 27.    Review of Systems:  As per HPI. All other systems negative after a 12-point system review.    Physical Exam:  BP 117/72   Pulse 90   Temp 97.8 F (36.6 C)   Resp 16   Wt 56.1 kg (123 lb 9.1 oz)   SpO2 99%   BMI 22.60 kg/m   General: Not in acute distress. Alert and oriented x3.  HEENT: Normocephalic, atraumatic, extraocular movements intact, mucosa moist.  Neck: Supple. No jugular venous distension, hepatojugular reflex, thyromegaly, lymphadenopathy, or bruit.  Lungs: Clear to auscultation bilaterally without wheezing.  Cardiac: Regular rate and rhythm. No murmur, rubs, or gallops.  Abdomen: Soft, benign with no tenderness to palpation.  Extremity: No edema. Distal pulses 2+ bilaterally. Normal muscle bulk.  Skin: No rash.  Neuro: Nonfocal.    Diagnostic Data:  Lipids:  No results found for: CHOL, HDL, HDLCH2, LDLCALC, LDL, TRIG, NHDL2, NONHDLCLSTRL, NHDL, VLDLC2, LPACHOLEST  Thyroid function panel:  No results found for: TSHHS, TSH, FREET4, T3  HgA1C:  No results found for: A1C  Cardiac biomarkers:  No results found for: BNP, ZST2R, ST2SOLUBLE, CPK, CKMBH, TROPI, TPIHS, CRPC, CRPU, CRPHISENS, CRPHS  CMP:   Lab Results   Component Value Date    SODIUM 136 09/10/2019    K 4.4 09/10/2019    CL 102 09/10/2019    CO2 29 09/10/2019    EBAL 5 09/10/2019    GLU 97 09/10/2019    BUN 9 09/10/2019    CREAT 0.4 (L) 09/10/2019     9.1 09/10/2019     CBC:   Lab Results   Component Value Date    WBCCOUNT 4.8 09/10/2019    RBC 4.66 09/10/2019    HGB 13.4 09/10/2019    HCT 39.8 09/10/2019    PLT 171  09/10/2019    MCV 85.4 09/10/2019    MCHC 33.6 09/10/2019    RDW 13.7 09/10/2019     GFR:  Lab Results   Component Value Date    GFR >60 09/10/2019     ECG on 09/29/2019 shows: sinus rhythm at 77 beats per minute.  Unremarkable ECG.  P.r.n. a ball 150 QRS 80 corrected QT 403 no Q-waves.  N such ormal R-wave progression     Assessment & Plan:  1.  Preop cardiovascular exam  RCRI 1 for surgery of intermediate risk. No complication from prior cranial surgery in 2017. No h/o MI, DM, CVA, CKD, or other acute uncontrolled severe CV symptoms or conditions. Exam benign and ECG wnl in clinic 09/29/2019. Coronary artery revascularization before elective vascular surgery does not significantly alter the long-term outcome.   - TTE to assess for resting LV function and rule out sig VHD. If these are absent, no further cardiac w/u prior to surgery    2. CP  Atypical   - echo  - consider TM echo - this does not preclude surgery    3. rapid/irregular heart beat  - 2-week ambulatory event monitor   - echo    Orders Placed This Encounter   Procedures   . Exercise ECHO w/US Enhancement Agent if needed   . ECG 12 Lead   . Complete 2D ECHO with Image Enhancement Agent if Necessary   . Zio Patch AT     Thank you for the opportunity to participate in the care of this patient. If you have questions, please contact me.    Sincerely,     Jill Alexanders, MD, PhD, [facility], FASE  Division of Cardiology  Department of Medicine   [place] [facility] [place], [place]  F. [phone]

## 2019-09-30 ENCOUNTER — Encounter (HOSPITAL_COMMUNITY): Payer: No Typology Code available for payment source | Admitting: Cardiovascular Disease

## 2019-09-30 ENCOUNTER — Telehealth: Payer: Self-pay | Admitting: Neurological Surgery

## 2019-09-30 ENCOUNTER — Ambulatory Visit: Payer: No Typology Code available for payment source | Attending: Physician Assistant

## 2019-09-30 ENCOUNTER — Ambulatory Visit
Admission: RE | Admit: 2019-09-30 | Discharge: 2019-09-30 | Disposition: A | Discharge status: Home / Self Care | Payer: No Typology Code available for payment source | Attending: Cardiovascular Disease | Admitting: Cardiovascular Disease

## 2019-09-30 DIAGNOSIS — Z01810 Encounter for preprocedural cardiovascular examination: Secondary | ICD-10-CM | POA: Insufficient documentation

## 2019-09-30 DIAGNOSIS — Z01812 Encounter for preprocedural laboratory examination: Secondary | ICD-10-CM | POA: Insufficient documentation

## 2019-09-30 DIAGNOSIS — Z1152 Encounter for screening for COVID-19: Secondary | ICD-10-CM

## 2019-09-30 DIAGNOSIS — R002 Palpitations: Secondary | ICD-10-CM | POA: Insufficient documentation

## 2019-09-30 DIAGNOSIS — R0600 Dyspnea, unspecified: Secondary | ICD-10-CM | POA: Insufficient documentation

## 2019-09-30 DIAGNOSIS — R0609 Other forms of dyspnea: Secondary | ICD-10-CM

## 2019-09-30 DIAGNOSIS — Z20822 Contact with and (suspected) exposure to covid-19: Secondary | ICD-10-CM | POA: Insufficient documentation

## 2019-09-30 DIAGNOSIS — R072 Precordial pain: Secondary | ICD-10-CM | POA: Insufficient documentation

## 2019-09-30 DIAGNOSIS — G935 Compression of brain: Secondary | ICD-10-CM | POA: Insufficient documentation

## 2019-09-30 DIAGNOSIS — Z1159 Encounter for screening for other viral diseases: Secondary | ICD-10-CM | POA: Insufficient documentation

## 2019-09-30 DIAGNOSIS — Z01818 Encounter for other preprocedural examination: Secondary | ICD-10-CM

## 2019-09-30 DIAGNOSIS — I361 Nonrheumatic tricuspid (valve) insufficiency: Secondary | ICD-10-CM

## 2019-09-30 LAB — COMPLETE 2D ECHO
AO Mean Gradient: 2.1 mmHg
AO Peak Velocity: 0.93 m/s
Aortic Valve Area: 1.8 cm2
LA Volume Index: 21.2 ml/m2
LV Diastolic Diameter: 4 cm
LV Ejection Fraction: 62.5 %
LV Stroke Volume Index: 27.4 ml/m2
Mitral Valve Area: 4.99 cm2
TR Velocity: 2.15 m/s

## 2019-09-30 NOTE — Progress Notes (Signed)
[facility] DEPARTMENT OF FAMILY MEDICINE - COVID Drive Through Testing    DATE/TIME: September 30, 2019 09:22    CHIEF COMPLAINT: COVID 19 Testing in absence of symptoms    Patient Name: [name]        DOB: June 05, 1971           MRN: [id number]    HISTORY OF PRESENT ILLNESS: [name] is a 49 year old female who presents for COVID19 testing with order pending from [facility] physician performing procedure. Patient is asymptomatic.      Diagnosis:  Diagnoses and all orders for this visit:    Screening for viral disease    Chiari malformation type I (CMS-HCC)  -     Coronavirus Disease 2019 (COVID-19) SCOV Nasal-Pharyngeal         Patient Verification:  Name and DOB    Visualization of Patient in Car: No Distress    Swab performed and Specimen sent to Lab:  Yes    Patient education provided to the patient. Counseling on masking, social distancing, and strict handwashing provided.    [place] to be notified of results by physician performing procedure.    Kae Heller, NP   Family Nurse Practitioner  [facility] COVID Testing Drive Through

## 2019-09-30 NOTE — Telephone Encounter (Signed)
S/w pt's sister:  Letter ready for pick up            [name]. 09/30/2019

## 2019-09-30 NOTE — Telephone Encounter (Signed)
The patients sister Victorino Dike called in to request a letter to be made for patients employer to be excused for leave of absence starting 10/02/2019 and if she can go pick up letter tomorrow. Please return her call to confirm when letter will be ready.

## 2019-10-01 LAB — CORONAVIRUS DISEASE 2019 (COVID-19) SCOV
COVID-19 Comment: NOT DETECTED
COVID-19 Result: NOT DETECTED

## 2019-10-02 ENCOUNTER — Encounter (HOSPITAL_COMMUNITY): Payer: No Typology Code available for payment source | Admitting: Neurological Surgery

## 2019-10-02 ENCOUNTER — Inpatient Hospital Stay: Payer: No Typology Code available for payment source

## 2019-10-02 ENCOUNTER — Inpatient Hospital Stay (HOSPITAL_COMMUNITY): Payer: No Typology Code available for payment source

## 2019-10-02 ENCOUNTER — Encounter: Admission: RE | Disposition: A | Discharge status: Home Health | Payer: Self-pay | Attending: Neurological Surgery

## 2019-10-02 ENCOUNTER — Inpatient Hospital Stay
Admission: RE | Admit: 2019-10-02 | Discharge: 2019-10-04 | DRG: 027 | Disposition: A | Discharge status: Home Health | Payer: No Typology Code available for payment source | Attending: Neurological Surgery | Admitting: Neurological Surgery

## 2019-10-02 DIAGNOSIS — G9389 Other specified disorders of brain: Secondary | ICD-10-CM

## 2019-10-02 DIAGNOSIS — G935 Compression of brain: Secondary | ICD-10-CM

## 2019-10-02 DIAGNOSIS — G950 Syringomyelia and syringobulbia: Secondary | ICD-10-CM

## 2019-10-02 DIAGNOSIS — Z7409 Other reduced mobility: Secondary | ICD-10-CM

## 2019-10-02 DIAGNOSIS — Z789 Other specified health status: Secondary | ICD-10-CM

## 2019-10-02 LAB — BASIC METABOLIC PANEL, BLOOD
BUN: 8 mg/dL (ref 7–25)
CO2: 24 mmol/L (ref 21–31)
Calcium: 9.7 mg/dL (ref 8.6–10.3)
Chloride: 109 mmol/L — ABNORMAL HIGH (ref 98–107)
Creat: 0.4 mg/dL — ABNORMAL LOW (ref 0.6–1.2)
Electrolyte Balance: 6 mmol/L (ref 2–12)
Glucose: 129 mg/dL — ABNORMAL HIGH (ref 70–115)
Potassium: 3.9 mmol/L (ref 3.5–5.1)
Sodium: 139 mmol/L (ref 136–145)
eGFR - high estimate: >60 (ref >59)
eGFR - low estimate: >60 (ref >59)

## 2019-10-02 LAB — CBC WITH DIFF, BLOOD
ANC automated: 5.9 10*3/uL (ref 2.0–8.1)
Basophils %: 0.3 %
Basophils Absolute: 0 10*3/uL (ref 0.0–0.2)
Eosinophils %: 0.1 %
Eosinophils Absolute: 0 10*3/uL (ref 0.0–0.5)
Hematocrit: 39.1 % (ref 34.0–44.0)
Hgb: 13 G/DL (ref 11.5–15.0)
Lymphocytes %: 13.6 %
Lymphocytes Absolute: 0.9 10*3/uL (ref 0.9–3.3)
MCH: 28.3 PG (ref 27.0–33.5)
MCHC: 33.4 G/DL (ref 32.0–35.5)
MCV: 84.9 FL (ref 81.5–97.0)
MPV: 8.5 FL (ref 7.2–11.7)
Monocytes %: 1.6 %
Monocytes Absolute: 0.1 10*3/uL (ref 0.0–0.8)
Neutrophils % (A): 84.4 %
PLT Count: 146 10*3/uL — ABNORMAL LOW (ref 150–400)
RBC: 4.6 10*6/uL (ref 3.70–5.00)
RDW-CV: 13.5 % (ref 11.6–14.4)
White Bld Cell Count: 6.9 10*3/uL (ref 4.0–10.5)

## 2019-10-02 LAB — TYPE, SCREEN & CROSSMATCH
ABO/Rh(D): A POS
Antibody Screen Result: NEGATIVE
Blood Expiration Date: 202104022359
Blood Expiration Date: 202104052359
Blood Type: 6200
Blood Type: 6200
Unit Division: 0
Unit Division: 0
Unit Type: A POS
Unit Type: A POS
Units Ordered: 2

## 2019-10-02 LAB — ABO/RH (D) RECHECK - LAB USE ONLY: ABO/Rh(D): A POS

## 2019-10-02 LAB — PHOSPHORUS, BLOOD: Phosphorus: 2.3 MG/DL — ABNORMAL LOW (ref 2.5–5.0)

## 2019-10-02 LAB — BLOOD GAS + ELECTROLYTES (ISTAT)
Base Excess (iSTAT): 3 MMOL/L
Calcium Ionized (iSTAT): 1.07 mmol/L — ABNORMAL LOW (ref 1.13–1.32)
Glucose (iSTAT): 109 MG/DL (ref 70–110)
HCO3 (iSTAT): 26 MMOL/L (ref 21–27)
K, (iSTAT): 3.4 MMOL/L — ABNORMAL LOW (ref 3.7–5.5)
O2 Sat (iSTAT): 100 % — ABNORMAL HIGH (ref 95–98)
PCO2 (iSTAT): 36 MMHG (ref 36–42)
PO2 (iSTAT): 226 MMHG — ABNORMAL HIGH (ref 80–104)
Sodium (iSTAT): 136 MMOL/L — ABNORMAL LOW (ref 138–146)
Total CO2 (iSTAT): 27 MMOL/L (ref 23–29)
pH (iSTAT): 7.48 — ABNORMAL HIGH (ref 7.38–7.42)

## 2019-10-02 LAB — BHCG (ISTAT) POC (LIO): Beta-hCG (iSTAT): <5 m[IU]/mL (ref <5)

## 2019-10-02 LAB — PT/INR/PTT
INR: 1.01 (ref 0.91–1.10)
PTT: 24.1 s (ref 23.9–35.9)
Prothrombin Time: 12.7 s (ref 11.7–13.6)

## 2019-10-02 LAB — MAGNESIUM, BLOOD: Magnesium: 2.1 mg/dL (ref 1.9–2.7)

## 2019-10-02 SURGERY — CRANIOTOMY, DECOMPRESSIVE, FOR CHIARI MALFORMATION
Anesthesia: General | Wound class: Class I (Clean)

## 2019-10-02 MED ORDER — SODIUM CHLORIDE 0.9 % IV SOLN
INTRAVENOUS | Status: DC
Start: 2019-10-02 — End: 2019-10-02

## 2019-10-02 MED ORDER — PHENYLEPHRINE HCL (PRESSORS) 10 MG/ML IV SOLN
INTRAVENOUS | Status: AC
Start: 2019-10-02 — End: 2019-10-02
  Filled 2019-10-02: qty 2

## 2019-10-02 MED ORDER — FENTANYL CITRATE (PF) 100 MCG/2ML IJ SOLN
INTRAMUSCULAR | Status: AC
Start: 2019-10-02 — End: 2019-10-02
  Filled 2019-10-02: qty 2

## 2019-10-02 MED ORDER — PROPOFOL 1000 MG/100 ML INFUSION (~~LOC~~)
INTRAVENOUS | Status: DC | PRN
Start: 2019-10-02 — End: 2019-10-02
  Administered 2019-10-02: 80 ug/kg/min via INTRAVENOUS
  Administered 2019-10-02: 70 ug/kg/min via INTRAVENOUS
  Administered 2019-10-02: 90 ug/kg/min via INTRAVENOUS
  Administered 2019-10-02: 100 ug/kg/min via INTRAVENOUS
  Administered 2019-10-02: 125 ug/kg/min via INTRAVENOUS
  Administered 2019-10-02: 90 ug/kg/min via INTRAVENOUS
  Administered 2019-10-02: 100 ug/kg/min via INTRAVENOUS
  Administered 2019-10-02: 150 ug/kg/min via INTRAVENOUS

## 2019-10-02 MED ORDER — BACITRACIN 500 UNIT/GM EX OINT (CUSTOM)
TOPICAL_OINTMENT | CUTANEOUS | Status: AC
Start: 2019-10-02 — End: 2019-10-02
  Filled 2019-10-02: qty 28.4

## 2019-10-02 MED ORDER — SURGIFLO HEMOSTATIC MATRIX KIT
PACK | CUTANEOUS | Status: AC
Start: 2019-10-02 — End: 2019-10-02
  Filled 2019-10-02: qty 2

## 2019-10-02 MED ORDER — BUPIVACAINE HCL (PF) 0.5 % IJ SOLN
INTRAMUSCULAR | Status: AC
Start: 2019-10-02 — End: 2019-10-02
  Filled 2019-10-02: qty 30

## 2019-10-02 MED ORDER — SODIUM CHLORIDE 0.9 % IV SOLN
INTRAVENOUS | Status: DC | PRN
Start: 2019-10-02 — End: 2019-10-02

## 2019-10-02 MED ORDER — CEFEPIME HCL 2 GM IJ SOLR CUSTOM
INTRAMUSCULAR | Status: DC | PRN
Start: 2019-10-02 — End: 2019-10-02
  Administered 2019-10-02: 2 g via INTRAVENOUS
  Administered 2019-10-02 (×2): 1 g via INTRAVENOUS

## 2019-10-02 MED ORDER — PROPOFOL 200 MG/20ML IV EMUL
INTRAVENOUS | Status: AC
Start: 2019-10-02 — End: 2019-10-02
  Filled 2019-10-02: qty 20

## 2019-10-02 MED ORDER — SENNA 8.6 MG OR TABS
8.6000 mg | ORAL_TABLET | Freq: Every evening | ORAL | Status: DC
Start: 2019-10-02 — End: 2019-10-04
  Administered 2019-10-02: 8.6 mg via ORAL
  Filled 2019-10-02 (×2): qty 1

## 2019-10-02 MED ORDER — MIDAZOLAM HCL 2 MG/2ML IJ SOLN
INTRAMUSCULAR | Status: DC | PRN
Start: 2019-10-02 — End: 2019-10-02
  Administered 2019-10-02: 1 mg via INTRAVENOUS

## 2019-10-02 MED ORDER — HYDRALAZINE HCL 20 MG/ML IJ SOLN
10.0000 mg | INTRAMUSCULAR | Status: DC | PRN
Start: 2019-10-02 — End: 2019-10-04
  Administered 2019-10-03 (×2): 10 mg via INTRAVENOUS
  Filled 2019-10-02: qty 1

## 2019-10-02 MED ORDER — ONDANSETRON HCL 4 MG/2ML IV SOLN
INTRAMUSCULAR | Status: DC | PRN
Start: 2019-10-02 — End: 2019-10-02
  Administered 2019-10-02: 4 mg via INTRAVENOUS

## 2019-10-02 MED ORDER — HYDROCODONE-ACETAMINOPHEN 5-325 MG OR TABS
2.0000 | ORAL_TABLET | ORAL | Status: DC | PRN
Start: 2019-10-02 — End: 2019-10-04
  Administered 2019-10-02 – 2019-10-03 (×4): 2 via ORAL
  Filled 2019-10-02 (×4): qty 2

## 2019-10-02 MED ORDER — MANNITOL 20 % IV SOLN
INTRAVENOUS | Status: AC
Start: 2019-10-02 — End: 2019-10-02
  Filled 2019-10-02: qty 500

## 2019-10-02 MED ORDER — ACETAMINOPHEN 325 MG PO TABS
650.0000 mg | ORAL_TABLET | ORAL | Status: DC | PRN
Start: 2019-10-02 — End: 2019-10-04

## 2019-10-02 MED ORDER — SUCCINYLCHOLINE CHLORIDE 20 MG/ML IJ SOLN
INTRAMUSCULAR | Status: AC
Start: 2019-10-02 — End: 2019-10-02
  Filled 2019-10-02: qty 5

## 2019-10-02 MED ORDER — THROMBIN 5000 UNIT EX SOLR
CUTANEOUS | Status: DC | PRN
Start: 2019-10-02 — End: 2019-10-02
  Administered 2019-10-02 (×2): 20000 [IU]

## 2019-10-02 MED ORDER — LEVETIRACETAM 500 MG/5ML IV SOLN
INTRAVENOUS | Status: AC
Start: 2019-10-02 — End: 2019-10-02
  Filled 2019-10-02: qty 10

## 2019-10-02 MED ORDER — FENTANYL CITRATE (PF) 100 MCG/2ML IJ SOLN
INTRAMUSCULAR | Status: DC | PRN
Start: 2019-10-02 — End: 2019-10-02
  Administered 2019-10-02 (×2): 50 ug via INTRAVENOUS

## 2019-10-02 MED ORDER — ONDANSETRON HCL 4 MG/2ML IV SOLN
4.0000 mg | Freq: Once | INTRAMUSCULAR | Status: AC | PRN
Start: 2019-10-02 — End: 2019-10-02
  Administered 2019-10-02 (×2): 4 mg via INTRAVENOUS
  Filled 2019-10-02: qty 2

## 2019-10-02 MED ORDER — DOCUSATE SODIUM 100 MG OR CAPS
100.0000 mg | ORAL_CAPSULE | Freq: Two times a day (BID) | ORAL | Status: DC
Start: 2019-10-02 — End: 2019-10-04
  Administered 2019-10-02 – 2019-10-03 (×2): 100 mg via ORAL
  Filled 2019-10-02 (×3): qty 1

## 2019-10-02 MED ORDER — OXYCODONE HCL 5 MG OR TABS
10.0000 mg | ORAL_TABLET | ORAL | Status: DC | PRN
Start: 2019-10-02 — End: 2019-10-02
  Administered 2019-10-02 (×2): 10 mg via ORAL
  Filled 2019-10-02: qty 2

## 2019-10-02 MED ORDER — VANCOMYCIN HCL 1 GM IV SOLR
Status: DC | PRN
Start: 2019-10-02 — End: 2019-10-02
  Administered 2019-10-02 (×2): 1000 mL

## 2019-10-02 MED ORDER — MIDAZOLAM HCL (PF) 2 MG/2ML IJ SOLN
INTRAMUSCULAR | Status: AC
Start: 2019-10-02 — End: 2019-10-02
  Filled 2019-10-02: qty 2

## 2019-10-02 MED ORDER — VASOPRESSIN 20 UNIT/ML IV SOLN
INTRAVENOUS | Status: DC | PRN
Start: 2019-10-02 — End: 2019-10-02
  Administered 2019-10-02 (×5): .5 [IU] via INTRAVENOUS

## 2019-10-02 MED ORDER — MANNITOL 20 % IV SOLN
INTRAVENOUS | Status: DC | PRN
Start: 2019-10-02 — End: 2019-10-02

## 2019-10-02 MED ORDER — LIDOCAINE HCL (PF) 0.5 % IJ SOLN
INTRAMUSCULAR | Status: AC
Start: 2019-10-02 — End: 2019-10-02
  Filled 2019-10-02: qty 50

## 2019-10-02 MED ORDER — PHENYLEPHRINE DILUTION 100 MCG/ML IJ SOLN
INTRAVENOUS | Status: DC | PRN
Start: 2019-10-02 — End: 2019-10-02
  Administered 2019-10-02 (×8): 100 ug via INTRAVENOUS
  Administered 2019-10-02: 50 ug via INTRAVENOUS
  Administered 2019-10-02 (×3): 100 ug via INTRAVENOUS

## 2019-10-02 MED ORDER — LIDOCAINE HCL 1 % IJ SOLN
INTRAMUSCULAR | Status: AC
Start: 2019-10-02 — End: 2019-10-02
  Filled 2019-10-02: qty 20

## 2019-10-02 MED ORDER — VANCOMYCIN HCL 1 GM IV SOLR
INTRAVENOUS | Status: AC
Start: 2019-10-02 — End: 2019-10-02
  Filled 2019-10-02: qty 4000

## 2019-10-02 MED ORDER — REMIFENTANIL HCL 2 MG IV SOLR
INTRAVENOUS | Status: DC | PRN
Start: 2019-10-02 — End: 2019-10-02
  Administered 2019-10-02: .2 ug/kg/min via INTRAVENOUS
  Administered 2019-10-02: .15 ug/kg/min via INTRAVENOUS
  Administered 2019-10-02: .2 ug/kg/min via INTRAVENOUS
  Administered 2019-10-02 (×2): .15 ug/kg/min via INTRAVENOUS

## 2019-10-02 MED ORDER — THROMBIN 5000 UNIT EX SOLR
CUTANEOUS | Status: AC
Start: 2019-10-02 — End: 2019-10-02
  Filled 2019-10-02: qty 4

## 2019-10-02 MED ORDER — ONDANSETRON HCL 4 MG/2ML IV SOLN
4.0000 mg | INTRAMUSCULAR | Status: DC | PRN
Start: 2019-10-02 — End: 2019-10-04

## 2019-10-02 MED ORDER — PHENYLEPHRINE HCL 10 MG/ML INJECTION WRAPPED RECORD
INTRAMUSCULAR | Status: DC | PRN
Start: 2019-10-02 — End: 2019-10-02
  Administered 2019-10-02: 5 ug/min via INTRAVENOUS
  Administered 2019-10-02: 10 ug/min via INTRAVENOUS
  Administered 2019-10-02: 8 ug/min via INTRAVENOUS
  Administered 2019-10-02 (×2): 20 ug/min via INTRAVENOUS
  Administered 2019-10-02 (×2): 10 ug/min via INTRAVENOUS

## 2019-10-02 MED ORDER — SUCCINYLCHOLINE CHLORIDE 100 MG/5ML IV SOSY
PREFILLED_SYRINGE | INTRAVENOUS | Status: DC | PRN
Start: 2019-10-02 — End: 2019-10-02
  Administered 2019-10-02 (×2): 80 mg via INTRAVENOUS

## 2019-10-02 MED ORDER — POVIDONE-IODINE 10 % EX SWAB
1.0000 | Freq: Two times a day (BID) | CUTANEOUS | Status: DC
Start: 2019-10-02 — End: 2019-10-02
  Administered 2019-10-02 (×2): 1 via NASAL

## 2019-10-02 MED ORDER — FENTANYL CITRATE (PF) 100 MCG/2ML IJ SOLN
50.0000 ug | INTRAMUSCULAR | Status: DC | PRN
Start: 2019-10-02 — End: 2019-10-02
  Filled 2019-10-02: qty 2

## 2019-10-02 MED ORDER — DEXAMETHASONE SOD PHOSPHATE PF 10 MG/ML IJ SOLN
INTRAMUSCULAR | Status: AC
Start: 2019-10-02 — End: 2019-10-02
  Filled 2019-10-02: qty 1

## 2019-10-02 MED ORDER — ACETAMINOPHEN 500 MG OR TABS
500.0000 mg | ORAL_TABLET | Freq: Once | ORAL | Status: AC
Start: 2019-10-02 — End: 2019-10-02
  Administered 2019-10-02 (×2): 500 mg via ORAL
  Filled 2019-10-02: qty 1

## 2019-10-02 MED ORDER — CALCIUM CHLORIDE 10 % IV SOLN
INTRAVENOUS | Status: DC | PRN
Start: 2019-10-02 — End: 2019-10-02
  Administered 2019-10-02 (×2): 1000 mg via INTRAVENOUS

## 2019-10-02 MED ORDER — REMIFENTANIL HCL 2 MG IV SOLR
INTRAVENOUS | Status: AC
Start: 2019-10-02 — End: 2019-10-02
  Filled 2019-10-02: qty 6

## 2019-10-02 MED ORDER — EPINEPHRINE HCL 1 MG/ML IJ SOLN (CUSTOM)
INTRAMUSCULAR | Status: AC
Start: 2019-10-02 — End: 2019-10-02
  Filled 2019-10-02: qty 1

## 2019-10-02 MED ORDER — HYDROCODONE-ACETAMINOPHEN 5-325 MG OR TABS
1.0000 | ORAL_TABLET | ORAL | Status: DC | PRN
Start: 2019-10-02 — End: 2019-10-02

## 2019-10-02 MED ORDER — LABETALOL HCL 5 MG/ML IV SOLN
10.0000 mg | INTRAVENOUS | Status: DC | PRN
Start: 2019-10-02 — End: 2019-10-03
  Administered 2019-10-02 (×4): 10 mg via INTRAVENOUS
  Filled 2019-10-02 (×2): qty 4

## 2019-10-02 MED ORDER — GLYCOPYRROLATE 1 MG/5ML IV SOSY
PREFILLED_SYRINGE | INTRAVENOUS | Status: DC | PRN
Start: 2019-10-02 — End: 2019-10-02
  Administered 2019-10-02: 10:00:00 0.2 mg via INTRAVENOUS
  Administered 2019-10-02: 0.1 mg via INTRAVENOUS
  Administered 2019-10-02: 0.2 mg via INTRAVENOUS

## 2019-10-02 MED ORDER — SODIUM CHLORIDE 0.9 % IV SOLN
2000.0000 mg | Freq: Once | INTRAVENOUS | Status: DC
Start: 2019-10-02 — End: 2019-10-02
  Filled 2019-10-02: qty 2000

## 2019-10-02 MED ORDER — PLASMA-LYTE A IV SOLN
10.0000 mL/h | INTRAVENOUS | Status: DC
Start: 2019-10-02 — End: 2019-10-02
  Administered 2019-10-02: 10 mL/h via INTRAVENOUS

## 2019-10-02 MED ORDER — DEXAMETHASONE SOD PHOSPHATE PF 10 MG/ML IJ SOLN
INTRAMUSCULAR | Status: DC | PRN
Start: 2019-10-02 — End: 2019-10-02
  Administered 2019-10-02 (×2): 10 mg via INTRATHECAL

## 2019-10-02 MED ORDER — FENTANYL CITRATE (PF) 100 MCG/2ML IJ SOLN
25.0000 ug | INTRAMUSCULAR | Status: DC | PRN
Start: 2019-10-02 — End: 2019-10-02
  Administered 2019-10-02 (×4): 25 ug via INTRAVENOUS

## 2019-10-02 MED ORDER — PROPOFOL 200 MG/20ML IV EMUL
INTRAVENOUS | Status: DC | PRN
Start: 2019-10-02 — End: 2019-10-02
  Administered 2019-10-02: 50 mg via INTRAVENOUS
  Administered 2019-10-02: 120 mg via INTRAVENOUS
  Administered 2019-10-02: 30 mg via INTRAVENOUS

## 2019-10-02 MED ORDER — LIDOCAINE 1% SOLN OPTIME (~~LOC~~)
INTRAMUSCULAR | Status: DC | PRN
Start: 2019-10-02 — End: 2019-10-02
  Administered 2019-10-02 (×2): 50 mg via INTRAVENOUS

## 2019-10-02 MED ORDER — CEFEPIME HCL 1 GM IJ SOLR
INTRAMUSCULAR | Status: AC
Start: 2019-10-02 — End: 2019-10-02
  Filled 2019-10-02: qty 1000

## 2019-10-02 MED ORDER — BACITRACIN 500 UNIT/GM EX OINT (CUSTOM)
TOPICAL_OINTMENT | CUTANEOUS | Status: DC | PRN
Start: 2019-10-02 — End: 2019-10-02
  Administered 2019-10-02 (×2): 1 via TOPICAL

## 2019-10-02 MED ORDER — POVIDONE-IODINE 10 % EX SWAB
Freq: Once | CUTANEOUS | Status: AC
Start: 2019-10-02 — End: 2019-10-02
  Administered 2019-10-02 (×2): 1 via NASAL

## 2019-10-02 MED ORDER — DEXTROSE-NACL 5-0.9 % IV SOLN (CUSTOM)
INTRAVENOUS | Status: DC
Start: 2019-10-02 — End: 2019-10-02

## 2019-10-02 MED ORDER — OXYCODONE HCL 5 MG OR TABS
5.0000 mg | ORAL_TABLET | ORAL | Status: DC | PRN
Start: 2019-10-02 — End: 2019-10-02

## 2019-10-02 MED ORDER — VASOPRESSIN 20 UNIT/ML IV SOLN
INTRAVENOUS | Status: AC
Start: 2019-10-02 — End: 2019-10-02
  Filled 2019-10-02: qty 1

## 2019-10-02 MED ORDER — HYDROCODONE-ACETAMINOPHEN 5-325 MG OR TABS
1.0000 | ORAL_TABLET | ORAL | Status: DC | PRN
Start: 2019-10-02 — End: 2019-10-04
  Administered 2019-10-03 – 2019-10-04 (×2): 1 via ORAL
  Filled 2019-10-02 (×2): qty 1

## 2019-10-02 MED ORDER — PROPOFOL 1000 MG/100ML IV EMUL
INTRAVENOUS | Status: AC
Start: 2019-10-02 — End: 2019-10-02
  Filled 2019-10-02: qty 300

## 2019-10-02 SURGICAL SUPPLY — 30 items
1.12MM X 6.35 FLUTED ROUTER (Drills/Bits/Burs/Taps/Reamers) ×3 IMPLANT
1.85MM X16MM FLUTED ROUTER (Drills/Bits/Burs/Taps/Reamers) ×3 IMPLANT
ADHERUS AUTOSPRAY ET DURAL SEALANT ×3 IMPLANT
BLADE CLIPPER SPECIALTY NEURO (Knives/Blades) ×3 IMPLANT
BURRS DISSECTING FLUTE MATCHSTICK OD3 MM STERILE DISPOSABLE BURR 5 CM SHORT ATTACHMENT (Drills/Bits/Burs/Taps/Reamers) ×3 IMPLANT
CATH IV 14GX2 FEP (Procedural wires/sheaths/catheters/balloons/dilators) ×6 IMPLANT
COTTONOIDS 1/2 X 1" X10 EA PATTIES (Dressings/packing) ×3 IMPLANT
COTTONOIDS 1/2 X 3" X10 EA PATTIES (Dressings/packing) ×3 IMPLANT
DRAPE COVER TABLE DECKER DBL PEDIGO STER (Drapes/towels) ×3 IMPLANT
DRAPE MICROSCOPE ZEISS VARI-LENS UNIVERSAL L150 IN X W52 IN SCRATCH RESISTANT LIGHT TRANSMISSION LOW (Drapes/towels) ×3 IMPLANT
DRAPE, SPLIT, 77X120 (Drapes/towels) ×3 IMPLANT
GLOVE CHARGE-GLOVES SURG (Misc Surgical Supply) ×24 IMPLANT
GLOVES BIOGEL 7.0 LTX (Gloves/Gowns) ×6 IMPLANT
Garment Compression Calf Regular Reprocessed  Up to 18in SCD Nonsterile (Dressings/packing) ×3 IMPLANT
INTEGRA BP SUTURABLE GRAFT 06 CM X 08 CM (Grafts) ×3 IMPLANT
KERRACONTACT DRESSING WOUND AG  04"X05" (10CMX12.5CM) (Dressings/packing) ×3 IMPLANT
KIT FCP 8.25IN OLSN 1.5MM STRG BYNT BP SMTH TIP INSL 4 1/8IN DISP (Kits/Sets/Trays) ×3 IMPLANT
MANIFOLD SCT NPTN 2 2 STD 4 PORT WST (Reusable instruments/equipment) ×3 IMPLANT
PACK SURGICAL CUSTOM BASIC NEURO LATEX FREE (Procedure Packs/kits) ×3 IMPLANT
PINS SKULL ADULT MAYFIELD 3EA/PK STER DISP (K-wires/fixation pins) ×3 IMPLANT
Programmer Patient Neurostimulator DBS Percept (External Programmers/transmitters) IMPLANT
SYRINGE 20ML LUER LOCK TIP (Misc Medical Supply) ×6 IMPLANT
Sponge Lap 18x18in Xray Detectable RFID Sterile (Dressings/packing) ×3 IMPLANT
Sponge Surgical 4x4in 16-ply Xray Detectable RFID Sterile (Dressings/packing) ×6 IMPLANT
Suture Monosof 3-0 P-12 45cm Black (Suture) ×12 IMPLANT
Suture Polysorb 0 GS-21 5x45cm Undyed (Suture) ×3 IMPLANT
TELFA SPONGE 1/2 X 3" (Dressings/packing) ×3 IMPLANT
TIMEOUT MARKER WITH LABEL MED STER SANDEL (Kits/Sets/Trays) ×6 IMPLANT
TOOL DSCT 4MM CRS BALL DMD BUR 5CM SHRT ATCH STRL DISP (Drills/Bits/Burs/Taps/Reamers) ×3 IMPLANT
TRAY CATH FOL TEMP 14FR URNM (Kits/Sets/Trays) ×3 IMPLANT

## 2019-10-02 NOTE — Anesthesia Postprocedure Evaluation (Addendum)
Anesthesia Post Note    Patient: [name]    Procedure(s) Performed: Procedure(s):  T3 AM ICU - Revision of prior suboccipital decompression for chiari malformation      Final anesthesia type: General    Patient location: PACU    Post anesthesia pain: adequate analgesia    Mental status: awake, alert  and oriented    Airway Patent: Yes    Last Vitals:    Vitals Value Taken Time   BP 155/79 10/02/19 1422   Temp 36.6    Pulse 94 10/02/19 1425   Resp 14 10/02/19 1425   SpO2 95 % 10/02/19 1425   Vitals shown include unvalidated device data.     Temperature >35.5: Yes    Post vital signs: stable    Hydration: adequate    N/V:no    Plan of care per primary team.

## 2019-10-02 NOTE — Progress Notes (Signed)
Neurosurgery Progress Note:  Hospital Day:  2  Postoperative Day: 1 Day of Surgery    24 Hour Course:  OR last night for revision of chiari     Subjective:  Complains of headache     Medications:  . docusate sodium  100 mg BID   . senna  8.6 mg HS       Objective:  Vitals:  Temperature:  [97.9 F (36.6 C)-98.6 F (37 C)] 98.2 F (36.8 C) (03/12 0400)  Blood pressure (BP): (101-159)/(50-88) 101/51 (03/12 0500)  Heart Rate:  [63-108] 64 (03/12 0500)  Respirations:  [11-29] 29 (03/12 0500)  Pain Score: Patient Sleeping, Respiratory Assessment Done (03/12 0500)  O2 Device: None (Room air) (03/12 0500)  SpO2:  [97 %-100 %] 99 % (03/12 0500)     Input/Output:  03/11 0600 - 03/12 0559  In: 2200 [P.O.:550; I.V.:1650]  Out: 1950 [Urine:1850]     Physical Exam:  Awake, alert, oriented x 3, perrl, face symmetric, moving all extremities with normal strength      Data:  Recent Labs     10/02/19  1409 10/03/19  0446   GLU 129* 145*   SODIUM 139 135*   K 3.9 3.9   CO2 24 25   CL 109* 103   BUN 8 11   CREAT 0.4* 0.4*   GFR >60 >60   PHOS 2.3* 3.9     Recent Labs     10/03/19  0446   WBCCOUNT 10.8*   HGB 12.5   MCV 84.8     No results for input(s): ALB, ALT, AST, CHOL, HDL, LDL, TRIG, A1C, PSA in the last 72 hours.  No results for input(s): BLOODCULT, FUNGALBC, AFBBACTCULT, URINECULTURE, QTFERON, QUANTIFERON, CRYPTOAG, CRYPTOCAGCSF, COCCICF, COCCICFCSF, COCCIIMMDIIF, COCCIIMMCSF, HISTOAGUR, CMVDNAQT, CMVDNAQTCSF in the last 72 hours.    Invalid input(s): SPUTUMCULT, RESPCULT       Radiology Results:   Ct: no hemorrhages     Review of Nursing Notes/Preventative measures:   No data found.  # Glycemic control at goal (Glucose 60-180):  No results for input(s): GLUCPOCT in the last 72 hours.   If No, document action:        Assessment/Plan: 49 year old female with history of chiari malformation s/p suboccipital decompression in 2017 with progressive symptoms found to have recurrent chiari now POD 1 s/p revision of chiari  decompression    - ICU level of care; step down today  - pain control  - PT/OT  - regular diet            Required Daily Device Assessment:  .

## 2019-10-02 NOTE — Anesthesia Procedure Notes (Signed)
Arterial Line Procedure Note  Procedure: Arterial Line Insertion Date & Time: Date & Time: 10/02/2019 8:43 AM   Preparation: Patient was prepped and draped in usual sterile fashion  Indications:hemodynamic monitoring  Location: left radial Universal Protocol: Universal Protocol: Verbal consent obtained, risks and benefits discussed, patient states understanding of the procedure being performed, the patient's understanding of the procedure matches consent given, procedure consent matches procedure scheduled, relevant documents present and verified, test results available and properly labeled, site marked, imaging studies available, required blood products, implants, devices, and special equipment available and Immediately prior to procedure a time out was called to verify the correct patient, procedure, equipment, support staff and site/side marked as required  Consent given by: Consent given by: patient  Patient identity confirmed: Patient identity confirmed by: verbally with patient and arm band   Anesthesia:    Anesthetic total:  mL Sedation: patient sedated (General Anesthesia)   Procedure Details: 20, Seldinger technique used and 2  Needle Gauge: 20  Number of insertion attempts 2  Ultrasound Guided Ultrasound Guided Procedure    Medications Administered at:  10/02/2019 8:43 AM  Post Procedure: dressing applied and Patient tolerated the procedure well with no immediate complications  dressing applied   Comments:

## 2019-10-02 NOTE — Op Note (Signed)
DATE OF SURGERY: 10/02/19    OPERATING SURGEON: Whitney Post, MD, PhD    FIRST ASSISTANT: Fransisco Hertz, DO    SECOND ASSISTANT: Judie Bonus, MD    PREOPERATIVE DIAGNOSIS:   Chiari 1 malformation    POSTOPERATIVE DIAGNOSIS:   Same.    OPERATION PERFORMED:   1. Revision suboccipital craniectomy, C1 laminectomy, partial C2 laminectomy and duraplasty for Chiari decompression.  2. Microdissection.    LEVEL OF INVOLVEMENT OF ATTENDING SURGEON: Total.     INDICATION FOR OPERATION: 49 year old year-old female who presented with chiari malformation s/p suboccipital decompression (2017; [place]) with persistent syrinx associated with symptoms of generalized numbness and left-sided weakness. The risks, benefits and alternative to the above procedure were discussed in detail with the patient. Consent was obtained.    TELEPHONE DIAGNOSIS: None.     SPECIMENS REMOVED: None.     ANESTHESIA: General.     INCISION: Linear midline sub-occipital.     BLOOD LOSS: 50 mL.     BLOOD PRODUCTS: none.      DRAINS: None.     SKIN CLOSURE: Nylon.    WOUND CLASSIFICATION: Clean.    PROCEDURE IN DETAIL: After proper identification, the patient was taken to the operating room and placed under general anesthesia. All appropriate lines were placed by the anesthesia team. She was positioned prone with all pressure points padded. The head was fixated with a Mayfield head holder.    The previous incision was marked out and hair pulled back from the incision site. The scalp was then prepped and draped in sterile fashion.    The skin incision was made with scalpel and further dissection down with electrocautery. The scalp was reflected to expose the suboccipital bone using cerebellar retractors. Extensive scarring and bony growth was noted. The edges of the previous craniectomy site were carefully identified using a combination of electrocautery and dissection using a penfield one. Likewise, the posterior arch of C1 and the spinous process of C2  were also exposed in subperiosteal fashion.     The previous craniectomy extending inferiorly and laterally using the diamond coarse drill. C1 laminectomy and partial C2 superior laminectomy were then performed with high speed drill, Kerrison rongeurs, and curettes.     The microscope was brought into the field and the dura open in Y-shaped fashion. Nurolon sutures were used to tack up the dural edges. The arachnoid layer was dissected with microscissors and nerve hook. The tonsils were identified bilaterally. The tonsils were then retracted laterally with a Penfield #1 and the PICA vessels identified bilaterally.    Next the arachnoid membranes were dissected to open the vallecula. The foramen on Magendie was identified and choroid plexus was seen in the fourth ventricle. Bipolar cautery was used to shrink back the tonsils bilaterally. Brisk flow of CSF was noted from the foramen of Magendie.     A bovine pericardium patch was cut into a triangular shaped with a 3 cm base and 2.5 cm height. This patch was then sutured in placed with Nurolon to perform duroplasty. Valsalva maneuver was performed to confirm watertight closure.    Adherus dural sealant was placed along the suture line of the duroplasty after copious irrigation of the wound was performed. The skin was closed in layered fashion using Polysorb sutures for the muscle and dermis with Nylon horizontal mattress sutures for skin closure. The patient's hair was washed with sterile saline and baby shampoo. Keracontact dressing was applied on top of the cleaned  and dry incision.    The patient was taken out of pins, extubated and transferred to the Post-Anesthesia Care Unit in stable condition.

## 2019-10-02 NOTE — Plan of Care (Signed)
Problem: Promotion of Perioperative Health and Safety  Goal: Promotion of Health and Safety of the Perioperative Patient  Description: The patient remains safe, receives treatment appropriate to the surgical intervention and patient's physiological needs and is discharged or transferred to the appropriate level of care.  Outcome: Resolved  Flowsheets (Taken 10/02/2019 1743)  Standard of Care: PACU SOC  Outcome Evaluation (rationale for progressing/not progessing:   pain control   maintain safety  Individualized Interventions and Recommendations:   post-op labs   post op CT

## 2019-10-02 NOTE — H&P (Signed)
History and Physical   Date: 10/02/2019  Attending Physician: Josem Kaufmann, MD    History of Present Illness:     Patient Identification  3 year oldfemale with a past medical history of a chiari malformation s/p suboccipital decompression (2017; [facility] [place]) presenting today to establish care for a subsequent suboccipital decompression with C1 laminectomy. 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 The patient's symptoms initially presented with complete weakness of her lower extremities in 2015 which identified her syrinx and chiari which occurred in [place]. She had improvement in her lower extremity and was able to regain her strength. In 2017, she had the suboccipital decompression performed which left her was some left leg weakness that improved over the course of a year with the use of a stationary bike. Currently, her symptoms include left plantar pain as well as generalized body numbness worse at night and better in the morning. In addition, she endorsees left handed weakness and intermittent headaches that are severe but resolve.The patient denies nausea, vomiting, and balance difficulties.     Today in pre-op the patient describes left hemibody pain and tingling with weakness.    Patient Active Problem List    Diagnosis Date Noted   . Chiari malformation type I (CMS-HCC) 08/13/2019     Added automatically from request for surgery [id number]         Previous Medical History:  No past medical history on file.    Previous Surgical History:  Chiari decompression    Medications:  Current Facility-Administered Medications   Medication Dose Route Frequency Provider Last Rate Last Admin   . acetaminophen (TYLENOL) tablet 500 mg  500 mg Oral Once Schlierkamp, Monica M, NP       . cefePIME (MAXIPIME) 2,000 mg in sodium chloride 0.9 % 50 mL IVPB  2,000 mg IntraVENOUS Once Josem Kaufmann, MD       . dextrose-sodium chloride 5%-0.9% infusion   IntraVENOUS Continuous Josem Kaufmann, MD       . plasmalyte-A infusion  10 mL/hr IntraVENOUS Continuous Schlierkamp, Shawnee Knapp, NP       . povidone-iodine 10 % swab 1 Application  1 Application Each Naris BID Josem Kaufmann, MD           Family History:   No family history on file.     Social History:  Social History     Socioeconomic History   . Marital status: Married     Spouse name: Not on file   . Number of children: Not on file   . Years of education: Not on file   . Highest education level: Not on file   Occupational History   . Not on file   Social Needs   . Financial resource strain: Not on file   . Food insecurity     Worry: Not on file     Inability: Not on file   . Transportation needs     Medical: Not on file     Non-medical: Not on file   Tobacco Use   . Smoking status: Never Smoker   . Smokeless tobacco: Never Used   Substance and Sexual Activity   . Alcohol use: Not on file   . Drug use: Not on file   . Sexual activity: Not on file   Lifestyle   . Physical activity     Days per week: Not on file  Minutes per session: Not on file   . Stress: Not on file   Relationships   . Social Wellsite geologist on phone: Not on file     Gets together: Not on file     Attends religious service: Not on file     Active member of club or organization: Not on file     Attends meetings of clubs or organizations: Not on file     Relationship status: Not on file   . Intimate partner violence     Fear of current or ex partner: Not on file     Emotionally abused: Not on file     Physically abused: Not on file     Forced sexual activity: Not on file   Other Topics Concern   . Not on file   Social History Narrative   . Not on file        Allergies:  No Known Allergies    Review of Systems: A 14-point review of systems was obtained and was negative except as mentioned above in the HPI    Physical Examination:     Vital signs in last 24 hours:       Physical Examination:    General: No acute distress  HEENT: Normocephalic, atraumatic  Neck: supple;  suboccipital incision appears clean, dry and intact  CV: normal rate  Lungs: No labored respirations  Abdomen: Non-distended   Extremities: No deformity noted   Skin: No rashes or skin lesions   Back: No deformity noted, no point tenderness     Awake, alert, oriented, speech fluent  PERRL, EOMI, facial sensation equal bilaterally, face symetric at rest and on excursion, hearing grossly intact, voice normal, shoulder shrug equal, tongue protrudes to midline   Shoulder Deltoid Biceps Triceps Grip Interossei HipFlex HipExt LegFlex LegExt DorsiFlex PlantarFlex   L 5 5 4+ 4+ 4 4 4+ 4+ 4+ 4+ 4+ 4+   R 5 5 5 5 5 5 5 5 5 5 5 5    Sensation intact to light touch throughout    Diagnostic Studies:     Labs:   Lab Results   Component Value Date    RBC 4.66 09/10/2019    HGB 13.4 09/10/2019    HCT 39.8 09/10/2019    PLT 171 09/10/2019       Lab Results   Component Value Date    K 4.4 09/10/2019    CL 102 09/10/2019    CO2 29 09/10/2019    BUN 9 09/10/2019    Leonard 9.1 09/10/2019       No results found for: TPROT, ALBUMIN, AST, ALT, LIPASE    Lab Results   Component Value Date    PTT 28.9 09/10/2019    INR 0.99 09/10/2019       Assessment:     48 year oldfemale with a past medical history of a chiari malformation s/p suboccipital decompression (2017; [place]) presenting today to establish care for a subsequent suboccipital decompression with C1 laminectomy.  The patient has continued LUE weakness and a significant syrinx that extends from the cervical spine down to the lumbar spine. The patient was explained the risks and benefits of the surgery. She would like to proceed with surgery.    Patient Active Problem List    Diagnosis Date Noted   . Chiari malformation type I (CMS-HCC) 08/13/2019     Added automatically from request for surgery 08/15/2019  Report Electronically Signed By: [place] Kaylany Tesoriero, MD on 10/02/2019 6:09 AM   Resident Physician [id number]  Department of Neurological Surgery   Pager: [phone]

## 2019-10-02 NOTE — Consults (Addendum)
Reason for consult: ICU post-op care    History of Present Illness: This is a 49y/o woman with chiari malformation s/p suboccipital decompression in 2017 who presented for revision suboccipital craniectomy, C1 laminectomy, partial C2 laminectomy and duraplasty for Chiari decompression. Patient will be admitted to ICU for post-op care.     History was obtained from chart review: Patient developed lower extremity weakness in 2015 which prompted work up that revealed cervical to lumbar syrinx and Chiari malformation. She underwent suboccipital decompression in 2017 after which she had some left leg weakness. which improved over time. Prior to surgery, she endorsed generalized body numbness at night that would improve during the day, left plantar and some left hemibody pain.     Past Medical History:  Chiari malformation    Past Surgical History:  Suboccipital decompression for chiari malformation     Family History:  Noncontributory for CAD, DM, [place]     Social History:  Tobacco: Non-smoker  Alcohol: None  Substance Abuse: None  Living Situation: Lives at home with husband and his son     Allergies:  No Known Allergies    Medications:  No outpatient medication     Review of Systems:  Negative except as in HPI above    Physical Exam:  Vital Signs:    Temperature:  [97.9 F (36.6 C)-98.2 F (36.8 C)] 97.9 F (36.6 C) (03/11 1335)  Blood pressure (BP): (112-157)/(62-80) 136/69 (03/11 1600)  Heart Rate:  [78-108] 98 (03/11 1600)  Respirations:  [11-27] 20 (03/11 1600)  Pain Score: 7 (03/11 1551)  O2 Device: None (Room air) (03/11 1600)  SpO2:  [98 %-100 %] 99 % (03/11 1600)     General: Young lady lying in bed complaining of mild headache  Lungs: Breath comfortabely on room air   Heart: Regular pulse   Abdomen: Soft w/o tenderness     Neurological:    Mental status: Awake, alert and oriented to person, place and date, expressive and receptive language intact. Attention and concentration are appropriate.   Speech:  Normal  Cranial nerves:    II: Pupils are equal, round, and equally reactive to light and accommodation.Visual fields are full to confrontation    III, IV, VI: EOM intact and conjugate, no gaze preference or deviation, no eyelid ptosis    V: Normal sensation in V1, V2, and V3 segments bilaterally.    VII: No facial asymmetry or weakness    VIII: Normal hearing to finger rub, no nystagmus    IX, X: Normal palatal elevation, no uvular deviation, voice is normal, gag reflex is intact     XI: 5/5 head turn and 5/5 shoulder shrug bilaterally    XII: Midline tongue protrusion  Motor:  No pronator drift.  Strength (R/L): Shoulder abduction (5/5), elbow extension (5/5), elbow flexion (5/5), finger extension (5/5), finger flexion (5/5), finger abduction (5/4-chronic), hip flexion (5/5), knee extension (5/5), knee flexion (5/5) ankle dorsiflexion (5/5), ankle plantar flexion (5/5)   Coordination: Finger to nose is intact bilaterally  Sensory exam: Intact to light touch throughout decreased at the bottom of the left foot (chronic)    Diagnostic Data:  Labs (last 24h):  BMP:  139 (03/11) 109* (03/11) 8 (03/11) 129* (03/11)   3.9 (03/11) 24 (03/11) 0.4* (03/11)    CBC:  6.9 (03/11) 13.0 (03/11) 146* (03/11)    39.1 (03/11)    COAG:  12.7 (03/11) 24.1 (03/11)   1.01 (03/11)       Radiology:  Ct  Head W/o Contrast    Result Date: 10/02/2019  Expected postsurgical changes in the setting of revision suboccipital craniectomy and C1-C2 laminectomy as described.     Assessment: This is a 49y/o woman with chairi malformation s/p suboccipital decompression in 2017 who presented for revision suboccipital craniectomy, C1 laminectomy, partial C2 laminectomy and duraplasty.     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  # Decreased sensation of the left  plantar foot and weak left interossei-chronic   - ICU monitoring / Neuro checks q1h for high risk of neurological decline   - SBP < 140   - Sodium goal 135-145  - Pain management with Norco and Oxycodone per neurosurgery   - Post-op CT and labs were reviewed-unremarkable    CV  - Tele   - Nicardipine gtt; labetalol & hydralazine PRN for goal SBP < 140    Pulm  - Supplemental oxygen as needed to keep O2sat>92%    GI  - Regular diet per neurosurgery   - Bowel regimen     GU  - strict I's & O's; goal euvolemia    Endo  - RISS    Hem / ID   - Daily CBC     VTE Prevention:  - SCDs  - No SQ heparin for 48-72 hr due to ICH    FULL CODE    Discussed w/ attending Dr. Charlotta Newton.     Neurocritical Care Attending Attestation    I have seen and examined the patient with [facility] team.  Assessment and plan above developed with my direct supervision and input. I agree with the final findings and plan as documented in the record. My additions or revisions are included in the record.  I agreed with the documented exam    10/02/19  1715 10/02/19  1745 10/02/19  1826 10/02/19  2000   BP: 136/69 131/71 149/88 138/65   Pulse: 101 100 102 104   Resp: 19 18 20 20    Temp:  98.1 F (36.7 C) 98.2 F (36.8 C) 98 F (36.7 C)   SpO2: 98% 98% 99% 98%     Pupillometer:  Left Pupil:   Right Pupil:  Left Size (mm): 3.18 (10/02/19 2000)  Right Size (mm): 4.32 (10/02/19 2000)  Left Minimum (mm): 2.29   Right Minimum (mm): 2.91  Left Pupil Index: 4.6    Right Pupil Index: 4.2      lying in bed, NAD, pleasant  Awake alert oriented to date, place and name, fluent speech  VFF, PERRL, EOMI, face symmetric, tongue midline. no dysarthria  Antigravity in all extremities, no pronator drift. (slightly weaker in the L UE then in the R UE, baseline  Sensation intact to light touch in all extremities. Except for distal LLE- baseline    Heart: RRR,   Pulm: on room air   Abd: Non-distended.   Ext: No edema/ erythema, noted.    I reviewed imaging and laboratory  data  Active Hospital Problems    Diagnosis   . *Chiari malformation type I (CMS-HCC)     # s/p revision suboccipital craniectomy, C1 laminectomy, partial C2 laminectomy and duraplasty  -agree with ICU monitoring/q1 neurochecks for high risk of neurological decline  -Sodium goal - normonatremia  -sBP goal -normotension to prevent rebleeding    The rest of the plan as above  The patient is critically ill with high risk of neurological decline.     Casper Harrison, MD

## 2019-10-02 NOTE — RN OR/Procedure Note (Signed)
Patient temperature 36.6, added lower bear hugger at 38 for patient

## 2019-10-02 NOTE — Procedures (Signed)
Referring Physician: Margarita Rana, MD    Date of Operation:  October 02, 2019    Procedure:   This is a 49 year old female with Chiari malformation status post prior suboccipital decompression, who is undergoing revision of prior suboccipital decompression today.    Used Modalities  1. Upper extremity and lower extremity somatosensory evoked potentials (SSEP) were continuously monitored.  Electrical stimulation to the ulnar and posterior tibial nerves was delivered.  Recording montages included CZ-FZ, C3-C4, FZ-C5P.  All evoked potentials were recorded at the neck and scalp.  Electrodes were put in place prior to surgery and removed postoperatively.  2.  Motor evoked potentials were performed by transcranial electric stimulations and needle recording of select upper and lower extremity muscles.  Transcutaneous scalp needle electrodes were placed at C1-C2 positions according to international 10-20 electrode placement system.  Stimulation intensity was gradually titrated to elicit stable threshold MEPs.  3.  A 9-channel EEG was performed using needle electrodes placed according to the International 10-20 system of electrode placement.  A bipolar montage was used.      Interpretation of Baseline Findings:  Bilateral ulnar and posterior tibial SSEP cortical peaks were obtainable, adequate for monitoring.  Motor evoked responses were obtainable and reliable for monitoring from select upper and lower extremity muscles.  Baseline EEG demonstrated continuous pattern without obvious abnormalities.     Clinical Interpretation of Intraoperative Monitoring:  No significant changes in SSEPs or MEPs were detected during monitoring.  No acutely concerning EEG changes occurred.    Impression:  No intraoperative impairment was observed.       Real time direct supervision was provided during the entire procedure from 9:12 a.m. to 12:49 p.m.

## 2019-10-02 NOTE — Anesthesia Procedure Notes (Signed)
Intubation    Date/Time: 10/02/2019 8:26 AM  Performed by: Enis Slipper, CRNA  Authorized by: Darrold Junker, MD     Procedure Details:     Pre-oxygenated: Yes      Induction:  IV    Mask Ventilation:  Easy    Blade:  Mac 3    View Grade:  2    Tube type:  Oral cuffed (reinforced)    Tube size (mm):  6.5    Tube Secured at (cm):  21    Cricoid pressure: Yes      Stylet: Yes      Dental damage: No      Lip/Other damage: No      Bite block inserted:  Gauze (twin gauze rolls to protect teeth during NM)    ET-CO2 present: Yes      Breath sounds equal: Yes (and lubricated)      Eyes taped: Yes

## 2019-10-02 NOTE — RN OR/Procedure Note (Deleted)
Dr Koleen Nimrod at bedside with patient using doppler

## 2019-10-03 LAB — MRSA CULTURE
Culture Result: NEGATIVE
Culture Result: NEGATIVE
Culture Result: NEGATIVE
Culture Result: NEGATIVE

## 2019-10-03 LAB — PHOSPHORUS, BLOOD: Phosphorus: 3.9 MG/DL (ref 2.5–5.0)

## 2019-10-03 LAB — BASIC METABOLIC PANEL, BLOOD
BUN: 11 mg/dL (ref 7–25)
CO2: 25 mmol/L (ref 21–31)
Calcium: 8.8 mg/dL (ref 8.6–10.3)
Chloride: 103 mmol/L (ref 98–107)
Creat: 0.4 mg/dL — ABNORMAL LOW (ref 0.6–1.2)
Electrolyte Balance: 7 mmol/L (ref 2–12)
Glucose: 145 mg/dL — ABNORMAL HIGH (ref 70–115)
Potassium: 3.9 mmol/L (ref 3.5–5.1)
Sodium: 135 mmol/L — ABNORMAL LOW (ref 136–145)
eGFR - high estimate: >60 (ref >59)
eGFR - low estimate: >60 (ref >59)

## 2019-10-03 LAB — CBC WITH DIFF, BLOOD
ANC automated: 8.6 10*3/uL — ABNORMAL HIGH (ref 2.0–8.1)
Basophils %: 0.3 %
Basophils Absolute: 0 10*3/uL (ref 0.0–0.2)
Eosinophils %: 0 %
Eosinophils Absolute: 0 10*3/uL (ref 0.0–0.5)
Hematocrit: 36 % (ref 34.0–44.0)
Hgb: 12.5 G/DL (ref 11.5–15.0)
Lymphocytes %: 12.4 %
Lymphocytes Absolute: 1.3 10*3/uL (ref 0.9–3.3)
MCH: 29.4 PG (ref 27.0–33.5)
MCHC: 34.6 G/DL (ref 32.0–35.5)
MCV: 84.8 FL (ref 81.5–97.0)
MPV: 8.4 FL (ref 7.2–11.7)
Monocytes %: 7.5 %
Monocytes Absolute: 0.8 10*3/uL (ref 0.0–0.8)
Neutrophils % (A): 79.8 %
PLT Count: 144 10*3/uL — ABNORMAL LOW (ref 150–400)
RBC: 4.24 10*6/uL (ref 3.70–5.00)
RDW-CV: 13.4 % (ref 11.6–14.4)
White Bld Cell Count: 10.8 10*3/uL — ABNORMAL HIGH (ref 4.0–10.5)

## 2019-10-03 LAB — ECG 12-LEAD
P AXIS: 80 Deg
PR INTERVAL: 150 ms
QRS INTERVAL/DURATION: 80 ms
QT: 372 ms
QTc (Bazett): 403 ms
R AXIS: 71 Deg
R-R INTERVAL AVERAGE: 777 ms
T AXIS: 58 Deg
VENTRICULAR RATE: 77 {beats}/min

## 2019-10-03 LAB — MAGNESIUM, BLOOD: Magnesium: 2 mg/dL (ref 1.9–2.7)

## 2019-10-03 LAB — HEMOGLOBIN, HEMOCUE POINT OF CARE TESTING: Hem, Hemocue POC: 11.3 G/DL — ABNORMAL LOW (ref 11.5–15.0)

## 2019-10-03 MED ORDER — HEPARIN SODIUM (PORCINE) 5000 UNIT/ML IJ SOLN
5000.0000 [IU] | Freq: Three times a day (TID) | INTRAMUSCULAR | Status: DC
Start: 2019-10-03 — End: 2019-10-04
  Administered 2019-10-03 – 2019-10-04 (×5): 5000 [IU] via SUBCUTANEOUS
  Filled 2019-10-03 (×4): qty 1

## 2019-10-03 MED ORDER — LABETALOL HCL 5 MG/ML IV SOLN
10.0000 mg | INTRAVENOUS | Status: DC | PRN
Start: 2019-10-03 — End: 2019-10-04

## 2019-10-03 NOTE — Interdisciplinary (Addendum)
HH referrals sent to PPN    [facility] accepted for [facility] Monday. Mara CM informed.    Walker delivered to bedside.

## 2019-10-03 NOTE — Interdisciplinary (Signed)
Occupational Therapy Evaluation    Admitting Physician:  Josem Kaufmann, MD  Admission Date 10/02/2019    Inpatient Diagnosis:   Problem List       Codes    Decreased activities of daily living (ADL)     ICD-10-CM: Z78.9  ICD-9-CM: V49.89          IP Start of Service  Start of Care: 10/03/19  Reason for referral: Activity tolerance limitation;Decline in functional ability/mobility;Decline in performance of activities of daily living (ADL)    Preferred Language:Vietnamese         No past medical history on file.   No past surgical history on file.    OT Acute     Row Name 10/03/19 1000          Type of Visit    Type of Occupational Therapy note  Occupational Therapy Evaluation     Row Name 10/03/19 1000          Treatment Time    Treatment Start Time  0800     Total TIMED Treatment (min)  30     Total Treatment Time (min)  9     Row Name 10/03/19 1000          Treatment Precautions/Restrictions    Precautions/Restrictions  Fall;Postsurgical/procedural     Fall  Socks/charm     Other Precautions/Restrictions Information  SBP<140     Row Name 10/03/19 1000          Medical History    History of presenting condition  Pt is a 49 year old female with history of chiari malformation s/p suboccipital decompression in 2017 with progressive symptoms found to have recurrent chiari now s/p revision suboccipital craniectomy, C1 laminectomy, partial C2 laminectomy and duroplasty (3/11).     Fall history  No falls reported in the last 6 months     Row Name 10/03/19 1000          Functional History    Prior Level of Function  No deficits     General ADL/Self-Care Assistance Needs  None- Independent with ADLs and self care     Equipment required for mobility in the home  None     Row Name 10/03/19 1000          Social History    Living Situation  Lives with spouse/partner;Lives with family son and husband     [place] accessibility  [place] shower     [place] Name 10/03/19 1000          Subjective    Subjective  information  Pt received sitting up in chair, cleared by nsg.      Patient status  Patient agreeable to treatment;Nursing in agreement for treatment;Patient pain control adequate to participate in therapy     Row Name 10/03/19 1000          Pain Assessment    Pain Asssessment Tool  Numeric Pain Rating Scale     Row Name 10/03/19 1000          Numeric Pain Rating Scale    Pain Intensity - rating at present  5     Pain Intensity- rating after treatment  5     Location  headache     Row Name 10/03/19 1000          Activities of Daily Living (ADLs)    Self Feeding  Independent     Other Self Feeding  Information  sitting up in chair     Self Grooming  Independent     Upper Body Dressing  Minimum assistance (25% assistance)     Lower Body Dressing  Minimum assistance (25% assistance)     Other Lower Body Dressing Information  simulation of donning shorts     Bathing  Minimum assistance (25% assistance)     Toileting  Minimum assistance (25% assistance)     Toilet Transfers  Supervised     Other Toilet Transfers Information  hygiene care     [place] Name 10/03/19 1000          [place] AM-PAC: Daily Activity    Assistance Needed to Put on and Take off Regular Lower Body Clothing  3     Assistance Needed to Bathe, Including Washing, Rinsing, and Drying  3     Assistance Needed to Toilet Environmental manager, Bedpan, or Urinal)  3     Assistance Needed to Put on and Take off Regular Upper Body Clothing  3     Assistance Needed to Take Care of Personal Grooming Such as Brushing Teeth  4     Assistance Needed to Eat Meals  4     AM-PAC Daily Activity Total Score  20     AMP-PAC Daily Activity Impairment rating  Score 20-22 - 20-39% impaired     Row Name 10/03/19 1000          Objective    Overall Cognitive Status  Intact - no cognitive limitations or impairments noted     Communication  No communication limitations or impairments noted. Current status of hearing, speech and vision allow functional communication.     Coordination/Motor control   No limitations or impairments noted. Movement patterns are fluid and coordinated throughout     Balance  Balance limitations present     Static Sitting Balance  Good - able to maintain balance without handhold support, limited postural sway     Dynamic Sitting Balance  Good - accepts moderate challenge, able to maintain balance while picking object off floor     Static Standing Balance  Fair - able to maintain balance with handhold support, may require occasional minimal assistance     Dynamic Standing Balance  Fair - accepts minimal challenge, able to maintain balance while turning head/trunk     Extremity Assessment  Flexibility, strength, muscle tone and sensation grossly within functional limits throughout     Functional Mobility  Functional mobility deficits present     Bed Mobility  Supervised     Transfers to/from Stand  Supervised     Ambulation during functional tasks  Minimum assistance (25% assistance)     Device used for ambulation/mobility  Front wheeled walker     Ambulation Distance  household distance     Other Objective Findings  Pt BP stable throughout session: 126/64 at start of session, 128/60 at end of session. Pt was able to complete figure 4 position to simulate LB dsg with no issues. Pt was able to stand with min/sup and ambulate using the FWW with no LOB going at a slow and steady pace. Pt c/o headache but able to tolerate session. Pt transferred onto the toilet, voided and completed toileting care. Pt returned to bed at end of session with needs met.          OT Acute Tool Box     Row Name 10/03/19 1000          Cognition Assessment  Overall Cognitive Status  Intact - no cognitive limitations or impairments noted             Eval cont.     [place] Name 10/03/19 1000          Patient/Family Education    Learner(s)  Patient     Learner response to rehab patient education interventions  Verbalizes understanding;Able to return demonstrate teaching     [place] Name 10/03/19 1000          Assessment     Assessment  Pt is a 49 year old female with history of chiari malformation s/p suboccipital decompression in 2017 with progressive symptoms found to have recurrent chiari now s/p revision suboccipital craniectomy, C1 laminectomy, partial C2 laminectomy and duroplasty (3/11). Pt was overall min-supervised for ADLs and functional transfer/ambulation using the FWW, limited by pain and decreased activity tolerance. Pt would benefit from shower chair for home, CM aware. Pt would benefit from rehab at this time to work towards independence with ADLs.      Rehab Potential  Good     Row Name 10/03/19 1000          Goal 1 (Short Term)    Impairment  Activities of Daily Living - Lower Body Dressing     Custom goal  Pt will be supervised for LB dsg.      Number of visits  5     Goal Status  New     Row Name 10/03/19 1000          Goal 2 (Short Term)    Impairment  Functional ability/mobility - Toileting     Custom goal  Pt will be supervised for toilet transfer.     Number of visits  5     Goal Status  New     Row Name 10/03/19 1000          GOAL (Long Term)    Long Term Custom Goal  Pt will be back to PLOF in 3 months.      Long Term Goal Status  New     Row Name 10/03/19 1000          Planned Therapy Interventions and Rationale    Patient Education  to increase independence in functional activities;to increase safety during dynamic activities     Self-Care/ADL Training  to improve home safety;to improve safety when completing daily activities and self care     Therapeutic Activities  to improve ability to perform self care and ADL's;to improve transfers between surfaces;to restore functional performace using graded activities     Therapeutic Exercise  to increase strength to allow greater independence with self care and ADL's;to improve activity tolerance to allow greater independence with self care and ADL's     Row Name 10/03/19 1000          Treatment Plan Disussion    Treatment Plan Discussion and Agreement   Patient/family/caregiver stated understanding and agreement with the therapy plan     Row Name 10/03/19 1000          Treatment Plan    Continue therapy to address  Activity tolerance limitation;Decline in functional ability/mobility;Decline in performance of activities of daily living (ADL)     Frequency of treatment  5 times per week     Duration of treatment (number of visits)  Treatment will continue while in hospital and in need of skilled therapy services     Status of treatment  Patient evaluated  and will benefit from ongoing skilled therapy     Row Name 10/03/19 1000          Patient Safety Considerations    Patient safety considerations  Patient returned to bed at end of treatment;Call light left in reach and fall precautions in place;Patient may be at risk for falls     Patient assistive device requirements for safe ambulation  [place] Name 10/03/19 1000          Post Acute Discharge Recommendations    Discharge Rehabilitation Reccomendations [facility] ONLY)  Would benefit from intermittent Occupational therapy post acute discharge to maximize functional independence     Equipment recommendations  [place] Name 10/03/19 1000          Therapy Plan Communication    Therapy Plan Communication  Discussed therapy plan with Nursing and/or Physician     [place] Name 10/03/19 1000          Occupational Therapy Patient Discharge Instructions    Your Occupational Therapist suggests the following  Continue to complete your self care Activities of Daily Living as frequently as possible     Row Name 10/03/19 1000          Type of Eval    Moderate Complexity [phone])  Completed     Row Name 10/03/19 1000          Therapeutic Procedures    Self-Care/ADL Training [phone])  Activities of daily living training         Total TIMED Treatment (min)  15           The occupational therapist of record is endorsed by evaluating occupational therapist.

## 2019-10-03 NOTE — Plan of Care (Signed)
Problem: Promotion of health and safety  Goal: Promotion of Health and Safety  Description: The patient remains safe, receives appropriate treatment and achieves optimal outcomes (physically, psychosocially, and spiritually) within the limitations of the disease process by discharge.  Outcome: Progressing  Flowsheets  Taken 10/03/2019 0313  Standard of Care/Policy:   Critical Care   Skin   Falls Reduction  Outcome Evaluation (rationale for progressing/not progressing) every shift: check neuro q1 hour, PERRL, pupils brisk round and equal. complaints of headache, norco given. able to ambulate to the toilet. maintain SBP <140, antihypertensives PRN  Individualized Interventions/Recommendations (Discharge Readiness): discharge readiness not applicable at this time, continue current treatment  Individualized Interventions/Recommendations (Skin/Comfort/Safety/Mobility): encourage to turn q2 hours, mepilex on sacrum, call bell and belongings within reach, able to tolerate ambulating to bathroom, pain medication as ordered  Individualized Interventions/Recommendations (Knowledge): continue neurochecks q1 hour  Individualized Interventions/Recommendations: explain plan of care to patient  Taken 10/02/2019 2000  Patient /Family stated Goal: headache to go away  Note: Nursing Shift Summary    No data found.  No data found.             Rounding for the past 12 hrs:   Physician Rounding   10/02/19 2000 Not applicable this shift     Shift Comments for the past 12 hrs:   Comments Observations   10/02/19 1600 -- Covering meal break for Collene Leyden   10/02/19 1630 -- SBAR given back to [place], [place]   10/02/19 2000 Patient complaining of headache, comfort measures given and repositioned head. will give pain medicine whenever due. explained plan of care to patient. --

## 2019-10-03 NOTE — Progress Notes (Signed)
PROGRESS NOTE - NEUROCRITICAL CARE    Current Hospital Stay:   1 day - Admitted on: 10/02/2019    Events of the Last 24 Hours:   Stable neurological exam  C/o headache      Physical Exam:  Pupillometer:  Left Pupil:   Right Pupil:  Left Size (mm): 2.07 (10/03/19 0800)  Right Size (mm): 2.23 (10/03/19 0800)  Left Minimum (mm): 1.64   Right Minimum (mm): 1.75  Left Pupil Index: 4.7    Right Pupil Index: 4.8    lying in bed, NAD, pleasant  Awake alert oriented to date, place and name, fluent speech,  EOMI, face symmetric, tongue midline. no dysarthria  Antigravity in all extremities, no pronator drift. Slightly weaker in L UE  Decreased sensation to light touch indistal LLE- baseline - unchanged    Heart: RRR,   Pulm: on room air   Abd: Non-distended.   Ext: No edema/ erythema, noted.      Assessment:  Active Hospital Problems    Diagnosis   . *Chiari malformation type I (CMS-HCC) [G93.5]      Resolved Hospital Problems   No resolved problems to display.     49y/o woman with chairi malformation s/p suboccipital decompression in 2017 who presented for revision suboccipital craniectomy, C1 laminectomy, partial C2 laminectomy and duraplasty.     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  # Decreased sensation of the left plantar foot and weak left interossei-chronic   - ICU monitoring / Neuro checks q1h for high risk of neurological decline   - SBP  normotension  - Sodium goal normonatremia    CV    -  labetalol & hydralazine PRN for goal SBP as above    Pulm  - Supplemental oxygen as needed to keep O2sat>92%    GI  - Regular diet per neurosurgery   - Bowel regimen     GU  - strict I's & O's; goal euvolemia    Endo  - RISS    Hem / ID   - Daily CBC     VTE Prevention:  - SCDs  -  SQ heparin when safe from Sx  perspectives    NeuroICU will sign off    Casper Harrison, MD    ==================================================    Ancillary Data:    Medications:   . docusate sodium  100 mg BID   . heparin  5,000 Units Q8H   . senna  8.6 mg HS         Intake/Output (24 Hours):  03/11 0600 - 03/12 0559  In: 2200 [P.O.:550; I.V.:1650]  Out: 1950 [Urine:1850]    Wt Readings from Last 1 Encounters:   10/03/19 54.8 kg (120 lb 13 oz)       Vital Signs:  Temperature:  [97.9 F (36.6 C)-98.6 F (37 C)] 97.9 F (36.6 C) (03/12 0800)  Blood pressure (BP): (101-159)/(50-88) 142/68 (03/12 1137)  Heart Rate:  [63-108] 84 (03/12 1137)  Respirations:  [11-37] 18 (03/12 1137)  Pain Score: 5 (03/12 1137)  O2 Device: None (Room air) (03/12 1137)  SpO2:  [97 %-100 %] 99 % (03/12 1137)    Respiratory Support:   Oxygen Therapy  SpO2: 99 %  O2 Device: None (Room air)          EVD:  No data recorded  Laboratory:   BMP:  135* (03/12) 103 (03/12) 11 (03/12) 145* (03/12)   3.9 (03/12) 25 (03/12) 0.4* (03/12)      Sodium Trend:  Recent Labs     10/02/19  1409 10/03/19  0446   SODIUM 139 135*       CBC:  10.8* (03/12) 12.5 (03/12)      36.0 (03/12)      COAG:  12.7 (03/11) 24.1 (03/11)   1.01 (03/11)     LFT:                       ABG:  No results found for: BPHT, PCO2T, PO2T, BICARB, BEXS, SO2    No results for input(s): GLUPOC in the last 72 hours.  No results found for: A1C, TSH  No results found for: TROPONIN    AEDS:  No results found for: DILNTN, PHENOBARB, VALPACID                Required Daily Device Assessment:    Patient does not have any central line or indwelling urinary catheter documented.

## 2019-10-03 NOTE — Interdisciplinary (Signed)
Physical Therapy Evaluation    Admitting Physician:  Josem Kaufmann, MD  Admission Date 10/02/2019    Inpatient Diagnosis:   Problem List       Codes    Decreased activities of daily living (ADL)     ICD-10-CM: Z78.9  ICD-9-CM: V49.89    Impaired functional mobility, balance, and endurance     ICD-10-CM: Z74.09  ICD-9-CM: V49.89          IP Start of Service   Start of Care: 10/03/19  Onset Date: 10/02/19  Reason for referral: Activity tolerance limitation;Decline in functional ability/mobility    Preferred Language:Vietnamese         No past medical history on file.   No past surgical history on file.    PT Acute     Row Name 10/03/19 1300          Type of Visit    Type of Physical Therapy note  Physical Therapy Evaluation     Row Name 10/03/19 1300          Treatment Precautions/Restrictions    Precautions/Restrictions  Fall;Multiple lines;Other (comments)     Fall  Socks/charm     Other Precautions/Restrictions Information  PIV, posterior head incision     Row Name 10/03/19 1300          Medical History    History of presenting condition  Pt is a 49 yr old female presenting s/p revision suboccipital craniectomy, CI laminectomy partial C2 laminectomy and duraplasty for chiari decompression and microdissection on 10/02/19.     Fall history  No falls reported in the last 6 months     Row Name 10/03/19 1300          Functional History    Prior Level of Function  No deficits     Equipment required for mobility in the home  None     Other Functional History Information  Pt reports she was independent with mobility prior to admit     [place] Name 10/03/19 1300          Social History    Living Situation  Lives with parent/family Husband and son     [place] accessibility   Performs activities of daily living (ADL's) on one level     [place] Name 10/03/19 1300          Subjective    Subjective Information  Pt found sitting up in bedside chair and agreeable to PT evaluation.      Patient status  Patient  agreeable to treatment;Nursing in agreement for treatment     Row Name 10/03/19 1300          Pain Assessment    Pain Asssessment Tool  Numeric Pain Rating Scale     Row Name 10/03/19 1300          Numeric Pain Rating Scale    Pain Intensity - rating at present  5     Pain Intensity- rating after treatment  5     Location  head     Row Name 10/03/19 1300          Objective    Overall Cognitive Status  Intact - no cognitive limitations or impairments noted     Communication  No communication limitations or impairments noted. Current status of hearing, speech and vision allow functional communication.     Coordination/Motor control  No limitations or impairments noted. Movement patterns are  fluid and coordinated throughout     Balance  Balance limitations present     Static Sitting Balance  Normal - able to maintain steady balance without handhold support     Dynamic Sitting Balance  Normal - accepts maximal challenge and can shift weight easily within full range in all directions     Static Standing Balance  Good - able to maintain balance without handhold support, limited postural sway     Dynamic Standing Balance  Fair - accepts minimal challenge, able to maintain balance while turning head/trunk     Other Balance Information  Standing with FWW     Extremity Assessment  Flexibility, strength, muscle tone and sensation grossly within functional limits throughout     Functional Mobility  Functional mobility deficits present     Bed Mobility  Supervised     Bed Mobility Comments  Safe with transfer from EOB to supine.      Transfers to/from Stand  Minimum assistance (25% assistance)     Transfer Comments  Cues for safe hand/foot placement with use of FWW     Gait  Minimum assistance (25% assistance)     Gait Comments  Ambulated with FWW, no LOB noted, 1 standing rest break due to pain.     Device used for [employer]  13ft     Other Objective Findings  Pt returned  to semifowlers in bed after ambulation with all needs within reach. RN aware.                Eval cont.     Row Name 10/03/19 1300          [place] AM-PAC: Basic Mobility    Assistance Needed to Turn from Back to Side While in a Flat Bed Without Using Bedrails  4 - None (independent)     Difficulty with Supine to Sit Transfer  3 - A little (supervised/min assist)     How Much Help Needed to Move to/from Bed to Chair  3 - A little (supervised/min assist)     Difficulty with Sit to Stand Transfer from Chair with Arms  3 - A little (supervised/min assist)     How Much Help Needed to Walk in Room  3 - A little (supervised/min assist)     How Much Help Needed to Climb 3-5 Steps with a Rail  3 - A little (supervised/min assist)     AMPAC Total Score  19     Assessment: AM-PAC Basic Mobility Impairment Rating  Score 19-22 - 20-39% impaired     Row Name 10/03/19 1300          Patient/Family Education    Learner(s)  Patient     Learner response to rehab patient education interventions  Verbalizes understanding;Able to return demonstrate teaching     Patient/family training comments  Role of PT staff, importance of mobility     Row Name 10/03/19 1300          Assessment    Assessment  Pt is a 49 yr old female presenting s/p revision suboccipital craniectomy, C1 laminectomy etc. on 10/02/19. Pt with decreased strength, balance and endurance impacting bed mobility, transfers and ambulation. Pt will benefit from skilled PT in order to address impairments, pt agreeable to PT POC. Pt returned to semifowlers in bed with all needs within reach. RN aware.      Rehab Potential  Excellent  Row Name 10/03/19 1300          Patient stated Goal    Patient stated goal  Get better     Row Name 10/03/19 1300          Goal 1 (Short Term)    Impairment  Functional mobility limitation     Custom goal  Pt will perform bed mobility Ind.      Number of visits  3-5     Goal Status  New     Row Name 10/03/19 1300          Goal 2 (Short Term)     Impairment  Functional mobility limitation     Custom goal  Pt will perform transfers with LRAD with Mod I     Number of visits  3-5     Goal Status  New     Row Name 10/03/19 1300          Goal 3 (Short Term)    Impairment  Gait impairment     Custom goal  Pt will ambulate 316ft with LRAD with Mod I.      Number of visits  3-5     Goal Status  New     Row Name 10/03/19 1300          GOAL (Long Term)    Long Term Goal Impairment  Functional mobility limitation     Long Term Custom Goal  Pt will return to PLOF in 1-2 months     Long Term Goal Status  New     Row Name 10/03/19 1300          Planned Therapy Interventions and Rationale    Gait Training  to normalize gait pattern and improve safety while ambulating with assistive device     Neuromuscular Re-Education  to improve safety during dynamic activities     Therapeutic Activities  to improve functional mobility and ability to navigate in the home and/or community     Theraputic Exercise  to increase range of motion to allow greater independence with functional mobility skills     Row Name 10/03/19 1300          Treatment Plan Disussion    Treatment Plan Discussion and Agreement  Patient support system determined and all questions were asked and answered     Row Name 10/03/19 1300          Treatment Plan    Continue therapy to address  Activity tolerance limitation;Decline in functional ability/mobility     Frequency of treatment  5 times per week     Duration of treatment (number of visits)  While patient is hospitalized and in need of skilled therapy services     Status of treatment  Patient evaluated and will benefit from ongoing skilled therapy     Row Name 10/03/19 1300          Patient Safety Considerations    Patient safety considerations  Patient returned to bed at end of treatment;Call light left in reach and fall precautions in place;Patient may be at risk for falls;Nursing notified of safety considerations at end of treatment     Patient assistive  device requirements for safe ambulation  Lupita Shutter Name 10/03/19 1300          Therapy Plan Communication    Therapy Plan Communication  Discussed therapy plan with Nursing and/or Physician     Row Name 10/03/19 1300  Physical Therapy Patient Discharge Instructions    Your Physical Therapist suggests the following  Continue to complete your home exercise program daily as instructed;Supervision with walking is suggested for increased safety     Row Name 10/03/19 1400          Type of Eval    Moderate Complexity [phone])  Completed     Row Name 10/03/19 1400          Therapeutic Procedures    Gait Training ([id number])  Assistive device training;Gait pattern analysis and treatment of deviations;Muscle facilitation to address gait deviation(s);Patient education;Postural alighnment/biomechanic training during gait;Weight shift and postural control activities during gait        Total TIMED Treatment (min)   15     Row Name 10/03/19 1300          Treatment Time     Total TIMED Treatment  (min)  30     Total Treatment Time (min)  60     Treatment start time  0830         Post Acute Discharge Recommendations  Discharge Rehabilitation Reccomendations [facility] ONLY): Would benefit from intermittent Physical therapy post acute discharge to maximize functional independence;1-3 times per week;Home safety  Equipment recommendations: Lyda Perone Justification: Patient safety and mobility is enhanced by the use of a walker. Patient has indicated agreement to utilize the walker during Mobility Related Activities of Daily Living (MRADLs) and is able to complete MRADLs in a more timely manner using a walker.    The physical therapist of record is endorsed by evaluating physical therapist.

## 2019-10-03 NOTE — Plan of Care (Signed)
Problem: Promotion of health and safety  Goal: Promotion of Health and Safety  Description: The patient remains safe, receives appropriate treatment and achieves optimal outcomes (physically, psychosocially, and spiritually) within the limitations of the disease process by discharge.  Outcome: Progressing  Flowsheets (Taken 10/03/2019 1733)  Standard of Care/Policy:   Medical/Surgical   Skin   Falls Reduction  Outcome Evaluation (rationale for progressing/not progressing) every shift:   VSS except of SBP >140 - resolved w/ hydralazine   headache fairly controlled w/ PO pain med   fair appetite, denies n/v, voids  Individualized Interventions/Recommendations (Discharge Readiness): Coordinate w/ CM on discharge planning needs when cleared by team  Individualized Interventions/Recommendations (Skin/Comfort/Safety/Mobility):   Monitor skin under the dressing for new breakdown encourage to turn every 2hrs while in bed   assist when OOB, reinforce use of call light when in need of assistance  Individualized Interventions/Recommendations (Knowledge):   Maintain fall and seizure precautions   monitor changes in change in CMS, keep strict I&Os

## 2019-10-04 DIAGNOSIS — G935 Compression of brain: Principal | ICD-10-CM

## 2019-10-04 MED ORDER — DOCUSATE SODIUM 250 MG OR CAPS
250.0000 mg | ORAL_CAPSULE | Freq: Two times a day (BID) | ORAL | 0 refills | Status: DC
Start: 2019-10-04 — End: 2019-10-15

## 2019-10-04 MED ORDER — HYDROCODONE-ACETAMINOPHEN 5-325 MG OR TABS
1.0000 | ORAL_TABLET | ORAL | 0 refills | Status: DC | PRN
Start: 2019-10-04 — End: 2019-11-05

## 2019-10-04 MED ORDER — DOCUSATE SODIUM 250 MG OR CAPS
250.0000 mg | ORAL_CAPSULE | Freq: Two times a day (BID) | ORAL | Status: DC
Start: 2019-10-04 — End: 2019-10-04
  Filled 2019-10-04: qty 1

## 2019-10-04 MED ORDER — SENNA 8.6 MG OR TABS
8.6000 mg | ORAL_TABLET | Freq: Two times a day (BID) | ORAL | Status: DC
Start: 2019-10-04 — End: 2019-10-04
  Filled 2019-10-04: qty 1

## 2019-10-04 NOTE — Discharge Instructions (Signed)
How to Take Care of Your Wound After Head Surgery     Removing the surgical bandage:   The bandage has been removed   Keep the incision open to air.    Avoid wearing any garments or accessories that put pressure on or rub against the wound, such as tight-fitting hats.   Loose-fitting hats and scarves are acceptable when going outside, but they must be breathable. Keep the wound open to air at all times when at home.   DO NOT touch or scratch the wound. Avoid touching it at all times.   DO NOT apply any ointment or lotions to the wound.  Showering:   Keep your wound dry for 3 days after surgery. You may shower if you can securely cover your wound with a plastic shower cap to prevent your bandage getting wet.   Three days after surgery you may shower your head and body.    Let the shower water run over the wound. Avoid touching the wound with your fingers.    You may wash your hair. Baby shampoo is recommended. Do not scrub the wound.   After showering, gently pat dry.    Avoid using hair products until after your postop appointment.   Do not submerge the wound into water, such as in a bath or swimming pool, until you have been told by your doctor that you can do this.   Bleeding and Infection:   If the wound starts to bleed, apply gentle pressure with sterile gauze for a few minutes. If bleeding persists or worsens, call your doctor or go to the emergency room.    If you notice any redness or drainage from the wound, call your doctor immediately or go the emergency room.   If you notice any areas of the wound opening up, call your doctor immediately or go to the emergency room.

## 2019-10-04 NOTE — Plan of Care (Signed)
Problem: Promotion of health and safety  Goal: Promotion of Health and Safety  Description: The patient remains safe, receives appropriate treatment and achieves optimal outcomes (physically, psychosocially, and spiritually) within the limitations of the disease process by discharge.  Flowsheets  Taken 10/03/2019 1733 by Mertha Finders, RN  Standard of Care/Policy:   Medical/Surgical   Skin   Falls Reduction  Outcome Evaluation (rationale for progressing/not progressing) every shift:   VSS except of SBP >140 - resolved w/ hydralazine   headache fairly controlled w/ PO pain med   fair appetite, denies n/v, voids  Individualized Interventions/Recommendations (Discharge Readiness): Coordinate w/ CM on discharge planning needs when cleared by team  Individualized Interventions/Recommendations (Skin/Comfort/Safety/Mobility):   Monitor skin under the dressing for new breakdown encourage to turn every 2hrs while in bed   assist when OOB, reinforce use of call light when in need of assistance  Individualized Interventions/Recommendations (Knowledge):   Maintain fall and seizure precautions   monitor changes in change in CMS, keep strict I&Os  Taken 10/03/2019 1137 by Mertha Finders, RN  Patient /Family stated Goal: Get better and go home  Taken 10/03/2019 0313 by Farley Ly, RN  Individualized Interventions/Recommendations: explain plan of care to patient  Note: Blood pressure 133/65, pulse 105, temperature 98.5 F (36.9 C), resp. rate 18, height 5\' 2"  (1.575 m), weight 54.8 kg (120 lb 13 oz), last menstrual period 09/13/2019, SpO2 97 %.     VSS. AOX4. Tolerating diet. CMS intact. Ambulating in room w/ assist. Voiding. Discussed plan of care, activity limitations, expectations, and pain management with pt who verbalized understanding and is agreeable. Pt resting comfortably. No distress noted at this time. Breathing even and unlabored. Call light in reach. Will continue to monitor.

## 2019-10-04 NOTE — Progress Notes (Signed)
Neurosurgery Progress Note:  Hospital Day:  3  Postoperative Day: 2    24 Hour Course:  3/11:  Revision suboccipital craniectomy, C1 laminectomy, partial C2 laminectomy and duraplasty for Chiari decompression.  3/12:  Transferred to med/surg.  Ambulated 125 feet with PT and worked with OT.  Planning for discharge to home with home health.  Moderate pain control with oral medications.           Subjective:  Complains of headache and posterior neck pain.     Current Facility-Administered Medications   Medication Dose Route Frequency Provider Last Rate Last Admin   . acetaminophen (TYLENOL) tablet 650 mg  650 mg Oral Q4H PRN Consuella Lose, MD       . docusate sodium (COLACE) capsule 250 mg  250 mg Oral BID Lindwood Coke, PA       . heparin injection 5,000 Units  5,000 Units Subcutaneous Q8H Hortencia Conradi, MD   5,000 Units at 10/03/19 2137   . hydrALAZINE (APRESOLINE) injection 10 mg  10 mg IntraVENOUS Q4H PRN Consuella Lose, MD   10 mg at 10/03/19 1634   . HYDROcodone-acetaminophen (NORCO) 5-325 MG tablet 1 tablet  1 tablet Oral Q4H PRN Consuella Lose, MD   1 tablet at 10/03/19 1007   . HYDROcodone-acetaminophen (NORCO) 5-325 MG tablet 2 tablet  2 tablet Oral Q4H PRN Consuella Lose, MD   2 tablet at 10/03/19 2258   . labetalol (NORMODYNE) injection 10 mg  10 mg IntraVENOUS Q20 Min PRN Hortencia Conradi, MD       . ondansetron [facility]) injection 4 mg  4 mg IntraVENOUS Q4H PRN Consuella Lose, MD       . senna (SENOKOT) tablet 8.6 mg  8.6 mg Oral BID Lindwood Coke, PA             Objective:  Vitals:  Temperature:  [97.9 F (36.6 C)-98.9 F (37.2 C)] 98.9 F (37.2 C) (03/12 2239)  Blood pressure (BP): (101-159)/(51-71) 121/69 (03/12 2239)  Heart Rate:  [64-111] 105 (03/12 2239)  Respirations:  [16-37] 16 (03/12 2239)  Pain Score: Patient Sleeping, Respiratory Assessment Done (03/13 0030)  O2 Device: None (Room air) (03/12 1831)  SpO2:  [96 %-100 %] 96 % (03/12 2239)     Input/Output:  03/12 0600 - 03/13  0559  In: 825 [P.O.:810; I.V.:15]  Out: 1500 [Urine:1500]     Physical Exam:  Physical Examination:   AAOx3, NAD  Eyes Open Spontaneously  PERL  Pupils 3'58mmR OU  CN II-XII grossly intact  No pronator drift noted  Moves all extremities equally  Follows commands  Incision dry and intact    Data:  BMP:  135* (03/12) 103 (03/12) 11 (03/12) 145* (03/12)   3.9 (03/12) 25 (03/12) 0.4* (03/12)      Sodium Trend:  Recent Labs     10/02/19  1409 10/03/19  0446   SODIUM 139 135*       CBC:  10.8* (03/12) 12.5 (03/12)      36.0 (03/12)      COAG:  12.7 (03/11) 24.1 (03/11)   1.01 (03/11)       Radiology Results:   No new imaging     Review of Nursing Notes/Preventative measures:   No data found.  # Glycemic control at goal (Glucose 60-180):  No results for input(s): GLUCPOCT in the last 72 hours.   If No, document action:        Assessment/Plan: 49 year old  female with history of chiari malformation s/p suboccipital decompression in 2017 with progressive symptoms found to have recurrent chiari now POD2 s/p revision suboccipital craniectomy, C1 laminectomy, partial C2 laminectomy and duraplasty for Chiari decompression.    - med/surg   - monitor incision  - regular diet  - PT/OT  - pain control  - bowel regiment   - d/c planning - home with home health     Electronically signed by: Winferd Humphrey, PA, on 10/04/2019 at 3:23 AM  Department of Neurological Surgery   [facility]          Required Daily Device Assessment:    Patient does not have any central line or indwelling urinary catheter documented.

## 2019-10-04 NOTE — Discharge Summary (Signed)
DISCHARGE SUMMARY    Patient Name:  [name]    Date of Admission:  10/02/2019  Date of Discharge:  10/04/19    Principal Diagnosis:  Chiari malformation type I (CMS-HCC)      Hospital Problem List:  Active Hospital Problems    Diagnosis   . *Chiari malformation type I (CMS-HCC) [G93.5]      Resolved Hospital Problems   No resolved problems to display.       Principal Procedure During This Hospitalization:  OPERATION PERFORMED:   1. Revision suboccipital craniectomy, C1 laminectomy, partial C2 laminectomy and duraplasty for Chiari decompression.  2. Microdissection.      Consultations Obtained During This Hospitalization:  Physical Therapy    Key consultant recommendations:  Post Acute Discharge Recommendations  Discharge Rehabilitation Reccomendations [facility] ONLY): Would benefit from intermittent Physical therapy post acute discharge to maximize functional independence;1-3 times per week;Home safety  Equipment recommendations: Lyda Perone Justification: Patient safety and mobility is enhanced by the use of a walker. Patient has indicated agreement to utilize the walker during Mobility Related Activities of Daily Living (MRADLs) and is able to complete MRADLs in a more timely manner using a walker.    Reason for Admission to the Hospital / History of Present Illness:  22 year oldfemale with a past medical history of a chiari malformation s/p suboccipital decompression (2017; [place]) presenting today to establish care for a subsequent suboccipital decompression with C1 laminectomy. 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 The patient's symptoms initially presented with complete weakness of her lower extremities in 2015 which identified her syrinx and chiari which occurred in [place]. She had improvement in her lower extremity and was able to regain her strength. In 2017, she had the suboccipital decompression performed which left her was some left  leg weakness that improved over the course of a year with the use of a stationary bike. Currently, her symptoms include left plantar pain as well as generalized body numbness worse at night and better in the morning. In addition, she endorsees left handed weakness and intermittent headaches that are severe but resolve.The patient denies nausea, vomiting, and balance difficulties.    Today in pre-op the patient describes left hemibody pain and tingling with weakness.    Hospital Course by Problem:  3/11:  Revision suboccipital craniectomy, C1 laminectomy, partial C2 laminectomy and duraplasty for Chiari decompression.  3/12:  Transferred to med/surg.  Ambulated 125 feet with PT and worked with OT.  Planning for discharge to home with home health.  Moderate pain control with oral medications.         Tests Outstanding at Discharge Requiring Follow Up:  None    Discharge Condition:  Stable    Key Physical Exam Findings at Discharge:  Mental Status Exam:  Patient is alert and oriented to person, place, time and situation.  No significant physical examination findings at the time of discharge.    Discharge Diet:  Diet Order Regular    Immunization History:    There is no immunization history on file for this patient.    Discharge Medications:     What To Do With Your Medications      START taking these medications      Add'l Info   docusate sodium 250 MG capsule  Commonly known as: COLACE  Take 1 capsule (250 mg) by mouth 2 times daily.   Quantity: 60 capsule  Refills:  0     HYDROcodone-acetaminophen 5-325 MG tablet  Commonly known as: NORCO  Take 1 tablet by mouth every 4 hours as needed for Moderate Pain (Pain Score 4-6).   Quantity: 15 tablet  Refills: 0           Where to Get Your Medications      These medications were sent to [facility] [place] - [address]  [place], [place] [address]    Phone: [phone]    docusate sodium 250 MG  capsule   HYDROcodone-acetaminophen 5-325 MG tablet           Allergies:  No Known Allergies    MDRO Status: Negative    Discharge Disposition:  Home with home care services:  home physical therapy    Discharge Code Status:  Full code / full care  This code status is not changed from the time of admission.    Follow Up Appointments:  Scheduled appointments:  Future Appointments   Date Time Provider [place]   10/08/2019 11:30 AM Vigil, Carpenter, PA [place] [id number]       For appointments requested for after discharge that have not yet been scheduled, refer to the Post Discharge Referrals section of the After Visit Summary.    Discharging 69 Contact Information:  Discharging Physician's Contact Information: [facility] at [phone]      [place] Maurisha Mongeau, MD  10/04/19 10:06 AM

## 2019-10-04 NOTE — Interdisciplinary (Signed)
Care Management Discharge Note                              Final Discharge Destination/Services  Final Discharge Destination/Services *: DME;Home Health         Home Health/Home Infusion  Home Health [facility] PPN): [facility] [address], [address] [place] Arthur [phone]    Home Health Care Agency *: [facility]  Phone *: [phone]     Pt is for dc today with home health follow up for home safety eval; per CMA, jennifer, FWW was delivered to the pt's room yesterday; pt's primary nurse validated the FWW is at the bedside. Spoke with the pt and made aware of the dc plan; she verified her home address on the face sheet is correct; pt's nurse also made aware of the plan:    Home Health Agency: [facility]  [phone]  Start of care: Monday, 3/15

## 2019-10-04 NOTE — Plan of Care (Signed)
Discharge home stable via hospitality lounge where instructions will be provided by RN. Headaches relieved by pain med. Send home will all belongings.

## 2019-10-05 LAB — MRSA DISCHARGE SCREEN
Culture Result: NEGATIVE
Culture Result: NEGATIVE

## 2019-10-08 ENCOUNTER — Ambulatory Visit: Payer: No Typology Code available for payment source | Attending: Physician Assistant | Admitting: Physician Assistant

## 2019-10-08 ENCOUNTER — Encounter: Payer: Self-pay | Admitting: Physician Assistant

## 2019-10-08 VITALS — BP 138/72 | HR 100 | Temp 98.6°F | Resp 18 | Ht 62.0 in | Wt 120.8 lb

## 2019-10-08 DIAGNOSIS — G935 Compression of brain: Secondary | ICD-10-CM | POA: Insufficient documentation

## 2019-10-08 NOTE — Progress Notes (Signed)
[facility]  Dept of Neurological Surgery  [name], [address]  [place], [place] [address]  [phone]      Neurosurgery Post Operative Visit    [address] Riley Contreras Contreras is a 48 yo F, [place] speaking only, with h/o with chiari malformation s/p suboccipital decompression (2017; [place]) with persistent syrinx associated with symptoms of generalized numbness and left-sided weakness. S/p revision suboccipital craniectomy, C1 laminectomy, partial C2 laminectomy and duraplasty for Chiari decompression on 10/02/2019 presents for post operative f/up.         The patient is doing well and improved since surgery. Overall improved UE strength and decreased Left hemibody paresthesias with improved sensory sensation in hand and Lt LE.  The patient rates her pain at 0 today. The patient denies problems with the incision or any fevers, chills or night sweats.       Allergies:   Patient has no known allergies.    Current Medications:  Current Outpatient Medications   Medication Sig Dispense Refill   . docusate sodium (COLACE) 250 MG capsule Take 1 capsule (250 mg) by mouth 2 times daily. 60 capsule 0   . HYDROcodone-acetaminophen (NORCO) 5-325 MG tablet Take 1 tablet by mouth every 4 hours as needed for Moderate Pain (Pain Score 4-6). 15 tablet 0     Current Facility-Administered Medications   Medication Dose Route Frequency Provider Last Rate Last Admin   . perflutren lipid microspheres (DEFINITY) injection 0.44 mg  0.4 mL IntraVENOUS Once Until Completed [place], MD           Physical Exam:  BP 138/72 (BP Location: Left arm, BP Patient Position: Sitting, BP cuff size: Regular)   Pulse 100   Temp 98.6 F (37 C) (Temporal)   Resp 18   Ht 5\' 2"  (1.575 m)   Wt 54.8 kg (120 lb 13 oz)   LMP 09/13/2019   BMI 22.10 kg/m     General:  WD, WN, in NAD, resting comfortably    Surgical Site: suboccipital incision c/d/i with Nylon sutures in place. NTTP. No palpable fluctuance, erythema, edema,  expressible drainage, or wound dehiscence.      Neuro Exam:  Awake, alert and oriented x 3, speech is fluent and coherent.  CN II-XII: PERRLA/B, EOMI/B, no nystagmus, VF intact to confrontation, tongue and palate are midline, face is symmetric, facial sensation intact and symmetric, shoulder shrug 5/5  Motor: BUE 5/5, no pronator drift, BLE 5/5    Tone: wnl  Cerebellar: FTN intact/B, no dysmetria  Sensory: intact to light touch/B,     Gait: normal, no ataxia on tandem, no assistive device    Imaging: Ct Head w/o 3/11:   Expected postsurgical changes in the setting of revision suboccipital craniectomy and C1-C2 laminectomy     Impression: 49 yo F, [place] speaking only, with h/o with chiari malformation s/p suboccipital decompression (2017; [place]) with persistent syrinx associated with symptoms of generalized numbness and left-sided weakness S/p revision suboccipital craniectomy, C1 laminectomy, partial C2 laminectomy and duraplasty for Chiari decompression on 10/02/2019 who is recovering well post op discharge. Neurologically, improved with overall increased b/l UE strength and decreased paresthesias.   -given revision, will leave nylon sutures for full 14 days. RTC next week for suture removal.     Plan:   I have discussed post-operative instructions. Wound care instructions have again been provided.   Patient made of aware of signs, symptoms and risk of infection. Call  clinic if she develops a fever, erythema, worsening pain, or drainage at surgical site.   -All of the patient's questions were answered.   -RTC next week on 3/24 for nylon suture removal      Thank you for allowing [place] to participate in the care of this patient.    Catherine Catalan, PA-C  Department of Neurological Surgery

## 2019-10-15 ENCOUNTER — Ambulatory Visit: Payer: No Typology Code available for payment source | Attending: Physician Assistant | Admitting: Physician Assistant

## 2019-10-15 ENCOUNTER — Encounter: Payer: Self-pay | Admitting: Physician Assistant

## 2019-10-15 VITALS — BP 125/57 | HR 95 | Temp 98.0°F | Resp 16 | Ht 62.0 in | Wt 120.7 lb

## 2019-10-15 DIAGNOSIS — G935 Compression of brain: Secondary | ICD-10-CM | POA: Insufficient documentation

## 2019-10-15 NOTE — Progress Notes (Signed)
[facility]  Dept of Neurological Surgery  [address]  [place], [place] [address]  [phone]      Neurosurgery Post Operative Visit    [address] Amelia Jo Tassinari is a 49 yo F, [place] speaking only, with h/o with chiari malformation s/p suboccipital decompression (2017; [place])with persistent syrinx associated with symptoms of generalized numbness and left-sided weakness. S/p revision suboccipital craniectomy, C1 laminectomy, partial C2 laminectomy and duraplasty for Chiari decompression on 10/02/2019 presents for cranial incision nylon suture removal.         Since the last visit on 10/08/19, endorses neurological improvement. Overall improved UE strength and decreased Left hemibody paresthesias with improved sensory sensation in hand and Lt LE.  The patient rates her pain at 0 today. The patient denies problems with the incision or any fevers, chills or night sweats.     Allergies:   Patient has no known allergies.    Current Medications:  Current Outpatient Medications   Medication Sig Dispense Refill   . HYDROcodone-acetaminophen (NORCO) 5-325 MG tablet Take 1 tablet by mouth every 4 hours as needed for Moderate Pain (Pain Score 4-6). 15 tablet 0     Current Facility-Administered Medications   Medication Dose Route Frequency Provider Last Rate Last Admin   . perflutren lipid microspheres (DEFINITY) injection 0.44 mg  0.4 mL IntraVENOUS Once Until Completed Jill Alexanders, MD           Physical Exam:  BP 125/57   Pulse 95   Temp 98 F (36.7 C) (Temporal)   Resp 16   Ht 5\' 2"  (1.575 m)   Wt 54.7 kg (120 lb 11.2 oz)   LMP 09/13/2019   SpO2 100%   BMI 22.08 kg/m     General:  WD, WN, in NAD, resting comfortably    Surgical Site: .suboccipital incision c/d/i with Nylon sutures in place. NTTP. No palpable fluctuance, erythema, edema, expressible drainage, or wound dehiscence.  Incision cleansed. Nylon sutures removed without difficulty. No bleeding.     Neuro  Exam:  Awake, alert and oriented x 3, speech is fluent and coherent.  CN II-XII: PERRLA/B, EOMI/B, no nystagmus, VF intact to confrontation, face is symmetric, facial sensation intact and symmetric, shoulder shrug 5/5  Motor: BUE 5/5, no pronator drift, BLE 5/5  Tone: wnl  Cerebellar: FTN intact/B, no dysmetria  Sensory: intact to light touch/B,   Gait: normal, no ataxia on tandem, no assistive device    Imaging:  No new imaging     Impression: 49 yo F, [place] speaking only, with h/o with chiari malformation s/p suboccipital decompression (2017; [place])with persistent syrinx associated with symptoms of generalized numbness and left-sided weakness S/p revision suboccipital craniectomy, C1 laminectomy, partial C2 laminectomy and duraplasty for Chiari decompression on 10/02/2019 who is recovering well post op discharge.   Neurologically, improved with overall increased b/l UE strength and decreased paresthesias.   -Nylon sutures removed from incision today. Incision healing well. No sign of infection.       Plan:   I have discussed post-operative instructions. Wound care instructions have again been provided.   Patient made of aware of signs, symptoms and risk of infection. Call clinic if she develops a fever, erythema, worsening pain, or drainage at surgical site.   -All of the patient's questions were answered.   -RTC PRN any problems/concerns      I spent 20 minutes on the day of the encounter reviewing the  patient's diagnostic tests including time spent preparing for today's visit (e.g. reviewing medical records and pertinent tests); independently interpreting results; obtaining and/or reviewing the patient's history; performing an appropriate physical examination/evaluation; counseling and educating the patient and/or family/caregiver; ordering appropriate medications, tests and/or procedures; referring and/or communicating with other healthcare professionals; coordinating care; and documenting in  the electronic medical record.    Malachi Carl, PA-C  Department of Neurological Surgery

## 2019-10-29 ENCOUNTER — Encounter: Payer: Self-pay | Admitting: Physician Assistant

## 2019-10-29 ENCOUNTER — Ambulatory Visit: Payer: No Typology Code available for payment source | Attending: Physician Assistant | Admitting: Physician Assistant

## 2019-10-29 ENCOUNTER — Telehealth: Payer: Self-pay | Admitting: Neurological Surgery

## 2019-10-29 VITALS — BP 121/56 | HR 87 | Temp 98.1°F | Ht 62.0 in | Wt 123.4 lb

## 2019-10-29 DIAGNOSIS — M62838 Other muscle spasm: Secondary | ICD-10-CM | POA: Insufficient documentation

## 2019-10-29 MED ORDER — METHOCARBAMOL 500 MG OR TABS
500.0000 mg | ORAL_TABLET | Freq: Two times a day (BID) | ORAL | 0 refills | Status: DC
Start: 2019-10-29 — End: 2019-11-05

## 2019-10-29 NOTE — Telephone Encounter (Signed)
Pt coming in for eval today 04/07

## 2019-10-29 NOTE — Telephone Encounter (Signed)
Patient's relative Victorino Dike was advise to call the MD regarding the patient's pain level. Transferred to [facility] on the iphone. Thank you

## 2019-10-30 NOTE — Progress Notes (Signed)
[facility]  Dept of Neurological Surgery  [address]  [place], [place] [address]  [phone]    NEUROSURGERY FOLLOW UP                                    Date of Evaluation: 10/30/2019      [name] is a 49 year old F, [place] speaking only, with h/owith chiari malformation s/p suboccipital decompression (2017; [place])with persistent syrinx associated with symptoms of generalized numbness and left-sided weakness.S/p revision suboccipital craniectomy, C1 laminectomy,partial C2 laminectomy and duraplasty for Chiari decompressionon 10/02/2019 seen today for evaluation of suboccipital incision fluctuant edema.   Reports on and off edema located at the incision site. Worse in upright position and improves when laying flat. Denies problems with incision, any drainage, erythema, edema or dehiscence. In addition, reports persistent neck neck and shoulder pain and tightness.     Overall neurologically improved with reported improved b/l UE strength and decreased Left hemibody paresthesias with improved sensory sensation in hand and Lt LE.The patient rates her pain at 5/10today. The patient denies problems with the incision or any fevers, chills or night sweats.       Allergies:   Patient has no known allergies.    Current Medications:  Current Outpatient Medications   Medication Sig Dispense Refill   . HYDROcodone-acetaminophen (NORCO) 5-325 MG tablet Take 1 tablet by mouth every 4 hours as needed for Moderate Pain (Pain Score 4-6). 15 tablet 0   . methocarbamol (ROBAXIN) 500 MG tablet Take 1 tablet (500 mg) by mouth 2 times daily. 14 tablet 0     Current Facility-Administered Medications   Medication Dose Route Frequency Provider Last Rate Last Admin   . perflutren lipid microspheres (DEFINITY) injection 0.44 mg  0.4 mL IntraVENOUS Once Until Completed Jill Alexanders, MD           Physical Exam:  BP 121/56 (BP Location: Left arm, BP Patient Position: Sitting)    Pulse 87   Temp 98.1 F (36.7 C)   Ht 5\' 2"  (1.575 m)   Wt 56 kg (123 lb 6.4 oz)   BMI 22.57 kg/m   Pain Score: 7    General:  WD, WN, in NAD.    Neuro Exam:  Awake, alert and oriented x 3, speech is fluent and coherent.  CN II-XII: PERRLA/B, EOMI/B, no nystagmus, face is symmetric, facial sensation intact and symmetric, shoulder shrug 5/5  Motor: BUE 5/5, no pronator drift, BLE 5/5  Tone: wnl  Sensory: intact to light touch/B,   Gait: normal, no ataxia on tandem, no assistive device    Special Exam:               Diagnostic Tests/Imaging:  None     Assessment:   49 yo F, [place] speaking only, with h/owith chiari malformation s/p suboccipital decompression (2017; [place])with persistent syrinx associated with symptoms of generalized numbness and left-sided weaknessS/p revision suboccipital craniectomy, C1 laminectomy,partial C2 laminectomy and duraplasty for Chiari decompressionon 10/02/2019, recovering well, neurologically improved with asymptomatic incisional site fluctuant edema, mild. Skin is intact. No drainage or dehiscence.        Plan:  -closely monitor, RTC in1 week or sooner if needed. -reviewed worrisome signs and symptoms and when to call our clinic immediately or seek urgent medical attention  -will plan for repeat MRI C-spine  wwo in 6 months from date of surgery to eval syrinx.     -Wound care instructions have again been provided.   Patient made of aware of signs, symptoms and risk of infection. Call clinic if she develops a fever, erythema, worsening pain, or drainage at surgical site.   -All of the patient's questions were answered       I spent13minutes on the day of the encounter reviewing the patient's diagnostic tests including time spent preparing for today's visit (e.g. reviewing medical records and pertinent tests); independently interpreting results; obtaining and/or reviewing the patient's history; performing an appropriate physical examination/evaluation;  counseling and educating the patient and/or family/caregiver; ordering appropriate medications, tests and/or procedures; referring and/or communicating with other healthcare professionals; coordinating care; and documenting in the electronic medical record.    All questions were answered to the patient and her family's satisfaction.  They understand to call us if they have any questions, concerns, or increase in neurological symptoms.        Thank you for allowing [place] to participate in the care of this patient.    Malachi Carl, PA-C    Physician Assistant  Dept of Neurosurgery

## 2019-11-03 ENCOUNTER — Telehealth: Payer: Self-pay | Admitting: Cardiovascular Disease

## 2019-11-03 NOTE — Telephone Encounter (Signed)
Received a referral for Zio Patch. Please review.    Thank you!

## 2019-11-03 NOTE — Telephone Encounter (Signed)
Called both numbers on pt's chart, no answer LVM to call us back and schedule nurse visit for monitor placement.

## 2019-11-05 ENCOUNTER — Encounter: Payer: Self-pay | Admitting: Physician Assistant

## 2019-11-05 ENCOUNTER — Ambulatory Visit: Payer: No Typology Code available for payment source | Attending: Physician Assistant | Admitting: Physician Assistant

## 2019-11-05 VITALS — BP 115/61 | HR 81 | Temp 98.1°F | Resp 16 | Ht 62.0 in | Wt 124.6 lb

## 2019-11-05 DIAGNOSIS — M62838 Other muscle spasm: Secondary | ICD-10-CM | POA: Insufficient documentation

## 2019-11-05 DIAGNOSIS — M25519 Pain in unspecified shoulder: Secondary | ICD-10-CM | POA: Insufficient documentation

## 2019-11-05 DIAGNOSIS — M542 Cervicalgia: Secondary | ICD-10-CM | POA: Insufficient documentation

## 2019-11-05 DIAGNOSIS — G935 Compression of brain: Secondary | ICD-10-CM

## 2019-11-05 MED ORDER — METHOCARBAMOL 500 MG OR TABS
1000.0000 mg | ORAL_TABLET | Freq: Two times a day (BID) | ORAL | 0 refills | Status: DC
Start: 2019-11-05 — End: 2020-04-07

## 2019-11-05 MED ORDER — ACETAMINOPHEN 500 MG OR TABS
500.0000 mg | ORAL_TABLET | ORAL | Status: DC | PRN
Start: ? — End: 2019-12-03

## 2019-11-05 NOTE — Progress Notes (Signed)
[facility]  Dept of Neurological Surgery  [address]  [place], [place] [address]  [phone]    NEUROSURGERY FOLLOW UP                                    Date of Evaluation: 11/05/2019      [name] is a 50 year old F, [place] speaking only, with h/owith chiari malformation s/p suboccipital decompression (2017; [place])with persistent syrinx associated and symptoms of generalized numbness and left-sided weakness, now S/p revision suboccipital craniectomy, C1 laminectomy,partial C2 laminectomy and duraplasty for Chiari decompressionon 10/02/2019 who presents today for close f/up after being seen on 10/29/19 for suboccipital incision fluctuant edema.     Since the last visit, incision site pain has decreased. Continues to Report on and off edema located at the incision site. Worse in supine position and improves when upright. Associated b/l shoulder pain described as "pulling."  C/o residual Left LE pins/needles, below knee down to foot although improved since surgery. Prior to surgery, reports numbness of lateral left thigh radiating down to foot.    Overall neurologically improved with reported improved b/l UE strength and decreased Left hemibody paresthesias with improved sensory sensation in hand and Lt LE.  The patient rates her pain at 1/10today. The patient denies problems with the incision or any fevers, chills or night sweats, any drainage, erythema, edema or dehiscence.     Allergies:   Patient has no known allergies.    Current Medications:  Current Outpatient Medications   Medication Sig Dispense Refill   . acetaminophen (TYLENOL) 500 MG tablet Take 500 mg by mouth as needed for Mild Pain (Pain Score 1-3).       Current Facility-Administered Medications   Medication Dose Route Frequency Provider Last Rate Last Admin   . perflutren lipid microspheres (DEFINITY) injection 0.44 mg  0.4 mL IntraVENOUS Once Until Completed Jill Alexanders, MD                  Physical Exam:  BP 115/61   Pulse 81   Temp 98.1 F (36.7 C) (Temporal)   Resp 16   Ht 5\' 2"  (1.575 m)   Wt 56.5 kg (124 lb 9.6 oz)   BMI 22.79 kg/m   Pain Score: 2    General:  WD, WN, in NAD.    Neuro Exam:  Awake, alert and oriented x 3, speech is fluent and coherent.  CN II-XII: PERRLA/B, EOMI/B, no nystagmus, VF intact to confrontation, tongue and palate are midline, face is symmetric, facial sensation intact and symmetric, shoulder shrug 5/5  Motor: BUE 5/5, no pronator drift, BLE 5/5    Motor D B T WE WF HI HF HE KF KE DF PF EHL   Right 5/5 5/5 5/5 5/5 5/5 5/5 5/5 5/5 5/5 5/5 5/5 5/5 5/5   Left 5/5 5/5 5/5 5/5 5/5 5/5 5/5 5/5 5/5 5/5 5/5 5/5 5/5     Tone: wnl  Cerebellar: FTN intact/B, no dysmetria  Sensory: intact to light touch/B,  negative Rhomberg  Gait: normal, no ataxia on tandem, no assistive device    Special Exam: suboccipital incision c/d/i, well healed.  NTTP. Mild palpable fluctuant edema, erythema, edema, expressible drainage, or wound dehiscence.      Diagnostic Tests/Imaging:  None     Assessment:   48 yo F, [place]  speaking only, with h/owith chiari malformation s/p suboccipital decompression (2017; [place])with persistent syrinx associated with symptoms of generalized numbness and left-sided weaknessS/p revision suboccipital craniectomy, C1 laminectomy,partial C2 laminectomy and duraplasty for Chiari decompressionon 10/02/2019, recovering well, neurologically improved with asymptomatic incisional site fluctuant edema, mild. Skin is intact. No drainage or dehiscence.        Recommendations/Plans:  -closely monitor, RTC in 4 week or sooner if needed. -reviewed worrisome signs and symptoms and when to call our clinic immediately or seek urgent medical attention  -refilled Robaxin, 1000 mg BID or as needed for cervical muscle spasms.   -will plan for repeat MRI C-spine wwo in 6 months from date of surgery to eval syrinx.     -Wound care instructions have again been  provided.   Patient made of aware of signs, symptoms and risk of infection. Call clinic if she develops a fever, erythema, worsening pain, or drainage at surgical site.   -All of the patient's questions were answered       I spent77minutes on the day of the encounter reviewing the patient's diagnostic tests including time spent preparing for today's visit (e.g. reviewing medical records and pertinent tests); independently interpreting results; obtaining and/or reviewing the patient's history; performing an appropriate physical examination/evaluation; counseling and educating the patient and/or family/caregiver; ordering appropriate medications, tests and/or procedures; referring and/or communicating with other healthcare professionals; coordinating care; and documenting in the electronic medical record.    All questions were answered to the patient and her family's satisfaction.  They understand to call us if they have any questions, concerns, or increase in neurological symptoms.      Thank you for allowing [place] to participate in the care of this patient.    Malachi Carl, PA-C    Physician Assistant  Dept of Neurosurgery

## 2019-11-05 NOTE — Telephone Encounter (Signed)
Unable to reach pt, LVM.

## 2019-11-07 DIAGNOSIS — G935 Compression of brain: Secondary | ICD-10-CM

## 2019-11-11 ENCOUNTER — Encounter: Payer: Self-pay | Admitting: Cardiovascular Disease

## 2019-12-03 ENCOUNTER — Encounter: Payer: Self-pay | Admitting: Neurological Surgery

## 2019-12-03 ENCOUNTER — Ambulatory Visit: Payer: No Typology Code available for payment source | Attending: Neurological Surgery | Admitting: Neurological Surgery

## 2019-12-03 VITALS — BP 132/54 | HR 85 | Temp 97.7°F | Resp 14 | Ht 62.0 in | Wt 125.0 lb

## 2019-12-03 DIAGNOSIS — G950 Syringomyelia and syringobulbia: Secondary | ICD-10-CM | POA: Insufficient documentation

## 2019-12-03 NOTE — Progress Notes (Signed)
Department of Neurological Surgery  [address],  [place] [address]  P: [phone] F: [phone]    NeuroSurgery Clinic Follow Up Note    Date of Evaluation: 12/03/2019  Referring Physician: Self, Referred  Primary Care Physician: Dawna Part    Chief Complaint and History of Present Illness:   I had the pleasure of seeing your patient [name] in the NeuroSurgery Clinic.  As you know, [name] is a 49 year old female s/p suboccipital decompression (2017; [place])with persistent syrinx associated and symptoms of generalized numbness and left-sided weakness, now s/p revision suboccipital craniectomy, C1 laminectomy,partial C2 laminectomy and duraplasty for Chiari decompressionon 10/02/2019. Postoperatively, the patient improved in terms of her BUE strength and all body paresthesias but has had issues with a pseudomeningocele as well as worsening LLE radiculopathy. She was last seen in clinic on 11/05/19 at which time non-surgical management and close monitoring was recommended. She reports that since she was last seen, the collection has enlarged. The collection gets worse when she is laying down or sitting down for extended periods of time. She is unable to rotate her head laterally due to pain and thus has not been able to drive or return back to work. Her incision continues to be well healed without issues and she has never had any systemic symptoms (denies fever/chills). Her total body paresthesias have improved since surgery but still persist and she endorses a lot of pain uncontrolled by Tylenol. She also reports worsening LLE radiculopathy since surgery that start in her back and goes down her lateral thigh and anterior leg into the medial side of her foot. No new numbness, weakness, or bladder/bowel function changes.     Past Medical History:  No past medical history on file.    Past Surgical History:      No past surgical history on file.    Home Medications:  Outpatient Encounter Medications as of 12/03/2019   Medication Sig Dispense Refill   . [DISCONTINUED] acetaminophen (TYLENOL) 500 MG tablet Take 500 mg by mouth as needed for Mild Pain (Pain Score 1-3).     . methocarbamol (ROBAXIN) 500 MG tablet Take 2 tablets (1,000 mg) by mouth 2 times daily. (Patient taking differently: Take 500 mg by mouth 2 times daily. ) 60 tablet 0     Facility-Administered Encounter Medications as of 12/03/2019   Medication Dose Route Frequency Provider Last Rate Last Admin   . perflutren lipid microspheres (DEFINITY) injection 0.44 mg  0.4 mL IntraVENOUS Once Until Completed Jill Alexanders, MD           Allergies:   Allergies   Allergen Reactions   . Tylenol [Acetaminophen] Rash and Itching        Physical Exam:  BP 132/54   Pulse 85   Temp 97.7 F (36.5 C) (Temporal)   Resp 14   Ht 5\' 2"  (1.575 m)   Wt 56.7 kg (125 lb)   BMI 22.86 kg/m   Constitutional: Well developed, well nourished, no acute distress  HEENT: Normocephalic, atraumatic  Heart:  Regular rate  Lungs: breathing comfortably on room air  Abd: Soft    Neurologic Exam:  General: Awake and alert, oriented x 3, recent and remote memory intact,  following commands, speech is fluent, good fund of knowledge.   Cranial Nerves: PERRL, EOMI, facial sensation is intact to light  touch, face symmetric, hearing intact to finger rub, palate elevates symmetrically, shoulder shrug strength 5/5, tongue midline.   Neck: 4 cm pseudomeningocele, incision well healed and closed, no erythema or drainage  Sensation is intact to light touch.  Reflexes are 2+ and symmetric  Functional/Coordination: No pronator drift  Motor:   RUE 5/5   LUE 4+/5  BLE 5/5    Labs: none new     Radiology: none new  MRI LS from 2020 reviewed - no significant stenosis to explain LLE radiculopathy  (interpretation from independent review of images).     ASSESSMENT and PLANS:  [name] is a 49 year old  female with h/owith chiari malformation s/p suboccipital decompression (2017; [place])with persistent syrinx associated with symptoms of generalized numbness and left-sided weaknessS/p revision suboccipital craniectomy, C1 laminectomy,partial C2 laminectomy and duraplasty for Chiari decompressionon 10/02/2019 who improved postoperatively in terms of her BUE strength and all body paresthesias but has had issues with a pseudomeningocele as well as worsening LLE radiculopathy. We discussed that the pseudomeningocele is benign but given her pain there are three options for the pseudomeningocele - continue watchful waiting (may scar over time), surgery to re-open her incision and revise, or placement of LP shunt. We discussed that we could tap the collection but it will likely come back given the connection to CSF. We reviewed her MRI LS from 2020 and discussed that her imaging does not show significant stenosis to explain her LLE radiculopathy which may be related to her syrinx. We recommend repeat imaging of her neck and following up after completion of imaging to discuss next steps.      - MRI c-spine w/o contrast, follow up after completion of imaging      - Follow up with Dr. Tonny Branch for intrinsic back issues       - Recommended taking OTC ibuprofen for pain control    Consuella Lose, MD  PGY-2  Department of Neurological Surgery

## 2020-01-07 ENCOUNTER — Inpatient Hospital Stay: Admit: 2020-01-07 | Discharge: 2020-01-07 | Disposition: A | Discharge status: Home Health | Payer: Self-pay

## 2020-03-08 ENCOUNTER — Telehealth: Payer: Self-pay | Admitting: Neurological Surgery

## 2020-03-08 NOTE — Telephone Encounter (Signed)
The patients sister called in to request a call back for rescheduling appointment she has with Hsu to a sooner one with Jolene. Thank you.

## 2020-03-08 NOTE — Telephone Encounter (Signed)
Called pt's sister, LVM to call back to scheduleling something sooner w/ Dr. Sabra Heck is currently not in the office.           [name]. 03/08/2020

## 2020-03-09 ENCOUNTER — Telehealth: Payer: Self-pay | Admitting: Neurological Surgery

## 2020-03-09 DIAGNOSIS — G935 Compression of brain: Secondary | ICD-10-CM

## 2020-03-09 DIAGNOSIS — G950 Syringomyelia and syringobulbia: Secondary | ICD-10-CM

## 2020-03-09 NOTE — Telephone Encounter (Signed)
Called Sister, LVM w/ information below       [place] M. 03/09/2020

## 2020-03-09 NOTE — Telephone Encounter (Signed)
S/w pt's sister [name], informed her pt needs to have imaging done before we can schedule a sooner appt w/ Dr. Raynald Kemp, she verbalized understanding     Provided [facility] scheduling number [phone]      [name]. 03/09/2020

## 2020-03-09 NOTE — Telephone Encounter (Signed)
Patient's sister called requesting that Sanaia's imaging order needs to be faxed to [facility] so that she can schedule Josely an appointment. I asked for the [facility] imaging fax number she stated that she does not have it and Dr. Has referred her sister Sean to [facility] imaging.    Please call back Victorino Dike sister

## 2020-03-09 NOTE — Telephone Encounter (Signed)
Orders fax to :    Services have been approved for:    [facility]  [address] [place]  [address]  [place], [place] [address]  [phone]  [phone]

## 2020-03-09 NOTE — Telephone Encounter (Signed)
Patient sister Victorino Dike returning call to clinic to schedule a sooner appt. Next avail 9/15. Please assist. Thank you

## 2020-03-10 ENCOUNTER — Telehealth: Payer: Self-pay | Admitting: Neurological Surgery

## 2020-03-10 NOTE — Telephone Encounter (Signed)
Victorino Dike, patient's relative, calling stated she called [facility] however they have not received order / authorization for patient's MRI.    Please call Victorino Dike, patient's sister, would like to know if this can be faxed over to [facility].    [facility]   Phone: [phone]

## 2020-03-10 NOTE — Telephone Encounter (Signed)
Victorino Dike patient's sister in law is requesting sooner appt than 09/15. Stated patient is in pain that radiates from neck down to both legs. Victorino Dike stated patient is taking pain medication but does not help. Please advise and assist, thank you.

## 2020-03-11 NOTE — Telephone Encounter (Signed)
See previous message.         [name]. 03/11/2020

## 2020-03-11 NOTE — Telephone Encounter (Signed)
S/w pt's sister Victorino Dike, informed her before we can get pt in sooner pt needs to have imaging done, Victorino Dike verbalized understanding.           Re faxed MRI orders to [facility] imaging, confirmation fax received.    [facility]  [address]  [address]  [place], [place] [address]  Phone:[phone]  Fax:[phone]            [name]. 03/11/2020

## 2020-03-15 ENCOUNTER — Telehealth: Payer: Self-pay | Admitting: Neurological Surgery

## 2020-03-15 NOTE — Telephone Encounter (Signed)
S/w jennifer to advise again that the appt is to go over the mri cspine that was done in June. She understood.

## 2020-03-15 NOTE — Telephone Encounter (Signed)
Chi calling in requesting to verify MRI order. Patient has an appointment tomorrow morning. Please further assist.

## 2020-03-15 NOTE — Telephone Encounter (Signed)
Called and s/w the front desk who advised the patient is coming in tomorrow for a cspine mri, but will need a new order and auth due to this was already completed in June. Advised not sure why the patient needs another order and auth, patient only needed to get the mri done once. The mri in June is all we needed. She understood and will cancel the appt for tomorrow.     Called the daughter and advised her of the above. I have the written report and she will be picking up the cd to bring it to the appt in September.

## 2020-03-15 NOTE — Telephone Encounter (Signed)
[name] calling in regards to pervious message on stating needs to know what MRI was done on June requesting an order. Requesting to speak to [name] in regards to MRI. Can you please assist, thank you.

## 2020-03-30 ENCOUNTER — Inpatient Hospital Stay: Admit: 2020-03-30 | Discharge: 2020-03-30 | Disposition: A | Discharge status: Home Health | Payer: Self-pay

## 2020-04-07 ENCOUNTER — Telehealth: Payer: Self-pay | Admitting: Neurological Surgery

## 2020-04-07 ENCOUNTER — Other Ambulatory Visit: Payer: Self-pay | Admitting: Neurological Surgery

## 2020-04-07 ENCOUNTER — Ambulatory Visit: Payer: No Typology Code available for payment source | Attending: Neurological Surgery | Admitting: Neurological Surgery

## 2020-04-07 ENCOUNTER — Encounter: Payer: Self-pay | Admitting: Neurological Surgery

## 2020-04-07 VITALS — BP 116/51 | HR 78 | Temp 96.6°F | Resp 16 | Ht 62.0 in | Wt 119.0 lb

## 2020-04-07 DIAGNOSIS — G935 Compression of brain: Secondary | ICD-10-CM | POA: Insufficient documentation

## 2020-04-07 DIAGNOSIS — Z6821 Body mass index (BMI) 21.0-21.9, adult: Secondary | ICD-10-CM

## 2020-04-07 DIAGNOSIS — G950 Syringomyelia and syringobulbia: Secondary | ICD-10-CM | POA: Insufficient documentation

## 2020-04-07 NOTE — Progress Notes (Signed)
[facility]  Dept of Neurological Surgery  [address] [place], [address], [address], [address]  [place], [place] [address]  P: [phone]  F: [phone]    NeuroSurgery Clinic Follow Up Note    Date of Evaluation: 04/07/2020  Referring Physician: Self, Referred  Primary Care Physician: Alonna Minium    Chief Complaint and History of Present Illness:   [name] is a 49 year old female seen for evaluation of a Chiari malformation with cervical syrinx s/p decompression first in 2017 and then again on 10/02/2019 (revision suboccipital craniectomy, c1 laminectomy, partial c2 laminectomy, duraplasty). The patient was previously seen on 12/03/2019 and presents today for followup evaluation.    Currently, [name] complains primarily of her LLE radiculopathy. She states the pain starts in her lower back and radiates down the back of her left leg to the bottom of her foot. The pain worsens with activity and standing and improves with rest. It has progressively worsened over the last several months. 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 She also reports numbness in her BUE on the outside of her arms that is intermittent. She otherwise has no bowel/bladder incontinence, tingling, or weakness. 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    Past Medical History:  As per HPI    Past Surgical History:   As per HPI    Social History:  Social History     Tobacco Use   . Smoking status: Never Smoker   . Smokeless tobacco: Never Used   Substance Use Topics   . Alcohol use: Not on file   . Drug use: Not on file       Family History:  No family history on file.      Allergies:   Allergies   Allergen Reactions   . Tylenol [Acetaminophen] Rash and Itching       Current Medications:  Outpatient Encounter Medications as of 04/07/2020   Medication Sig Dispense  Refill   . [DISCONTINUED] methocarbamol (ROBAXIN) 500 MG tablet Take 2 tablets (1,000 mg) by mouth 2 times daily. (Patient taking differently: Take 500 mg by mouth 2 times daily. ) 60 tablet 0     Facility-Administered Encounter Medications as of 04/07/2020   Medication Dose Route Frequency Provider Last Rate Last Admin   . perflutren lipid microspheres (DEFINITY) injection 0.44 mg  0.4 mL IntraVENOUS Once Until Completed [place], MD           Review of Systems:  CONSTITUTIONAL: No unexpected weight loss, fevers, chills, or anorexia.   NEUROLOGIC: Symptoms as noted above in HPI.   MUSCULOSKELETAL: Symptoms as noted above in HPI.      Physical Exam:  BP 116/51   Pulse 78   Temp 96.6 F (35.9 C) (Temporal)   Resp 16   Ht 5\' 2"  (1.575 m)   Wt 54 kg (119 lb)   BMI 21.77 kg/m   Constitutional: Well developed, well nourished, no acute distress    Neurologic Exam:  General: Awake and alert, oriented x 3, recent and remote memory intact, following commands, speech is fluent, primarily speaking, good fund of knowledge.   Cranial Nerves: PERRL, EOMI, face symmetric, palate elevates symmetrically, shoulder shrug strength 5/5, tongue midline.   Neck: supple with full range of motion.  She is able to flex and extend the neck without problem.  Sensation is intact to light touch.  Reflexes: Biceps 2+/B, triceps/brachioradialis 2+/B, neg Hoffman's/B, patellar 2+/B and ankle 2+/B, no ankle clonus  Functional/Coordination: No pronator drift, finger to nose is intact, rapid alternating movements are intact  Surgical site: well healed, no evidence of pseudomeningocele.    Motor D B T WE WF FF FE APB FDI HF KF KE DF PF EHL   Right 5/5 5/5 5/5 5/5 5/5 5/5 5/5 5/5 5/5 5/5 5/5 5/5 5/5 5/5 5/5   Left 5/5 5/5 5/5 5/5 5/5 5/5 5/5 5/5 5/5 5/5 5/5 5/5 5/5 5/5 5/5   Key: D= deltoid; B= biceps brachii; T= triceps; WE= wrist extensors; WF= wrist flexors; FF= finger flexors; FE= finger extensors; APB= thumb abductors; FDI=  first dorsal interosseus; HF= hip flexors; KF= knee flexors; KE= knee extensors; DF= ankle dorsiflexors; PF= ankle plantar flexors; EHL= extensor hallucis longus    Diagnostic Imaging: I personally reviewed the MRI cervical (01/07/2020) and lumbar spine (03/30/2020) performed. The cervical spine showed a large pseudomeningocele posterior to the suboccipital and C1/2 laminectomy defects. The craniocervical junction is well decompressed. The syrinx is significantly improved when compared to preoperative imaging. The lumbar spine shows no significant degenerative changes. There is no central or foraminal stenosis.  (interpretation from independent review of images).     ASSESSMENT/PLAN:  [name] is a 49 year old female with a chiari malformation s/p decompression who presents with worsening of her chronic LLE radiculopathy symptoms.  -Will order EMGs and have her follow up in 3 months    All questions were answered to the patient's and family's satisfaction.  They understand to call us if they have any other concerns or increase in neurological symptoms.     This patient was seen in conjunction with Dr. Raynald Kemp, Attending/Supervising Physician, who has evaluated the patient, formulated the plan, and agrees with the above.     Shared visit: Combined, we spent 30 minutes on providing care to this patient on the day of this ambulatory service, reviewing diagnostic tests and history, performing/updating history and physical examination, speaking with other providers and allied health professionals, counseling the patient, and documenting in the EMR. More than 50 percent of this time was spent discussing the diagnosis and the treatment plan. Any time spent teaching or on procedures is not counted in this total.    FOLLOWUP: Return to clinic 2 weeks. The patient is encouraged to call us with any questions or problems in the interim. Our contact numbers were given to the patient.      Buzzy Han, MD  [place]  Fellow  Department of Neurological Surgery

## 2020-04-07 NOTE — Telephone Encounter (Signed)
[name] sister called requesting that Dr. Raynald Kemp writes a letter for [facility] for leave of absence so that she can provide to her employer.    Per Gilford Raid will be bringing in her EDD form tomorrow into office, but is also requesting a leave of absence letter as per [name] stated Dr. Raynald Kemp informed her he would write her one .

## 2020-04-09 NOTE — Telephone Encounter (Signed)
EDD EXTENSION SUBMITTED     [id number]  04/04/2020 TO 07/04/2020        [name]. 04/09/2020

## 2020-04-09 NOTE — Telephone Encounter (Signed)
Letter ready at reception for pt pick up.     pt was notified.        [name]. 04/09/2020

## 2020-04-30 ENCOUNTER — Ambulatory Visit: Payer: No Typology Code available for payment source

## 2020-05-03 ENCOUNTER — Ambulatory Visit: Payer: No Typology Code available for payment source | Attending: Physical Medicine & Rehabilitation

## 2020-05-03 VITALS — BP 114/63 | HR 69 | Temp 97.0°F | Resp 12 | Ht 62.0 in | Wt 121.5 lb

## 2020-05-03 DIAGNOSIS — M5417 Radiculopathy, lumbosacral region: Secondary | ICD-10-CM | POA: Insufficient documentation

## 2020-05-03 MED ORDER — VITAMIN D3 125 MCG (5000 UT) PO CAPS
ORAL_CAPSULE | ORAL | Status: AC
Start: 2020-04-13 — End: ?

## 2020-05-03 NOTE — Patient Instructions (Signed)
You have just completed an electrodiagnostic evaluation, possibly including both the nerve conduction and the needle EMG portions.  After the needle EMG, you may have bruising in the areas where the needle examination took place or experience minor aches.  If the area is red, swollen, or continues to be painful, please call back to the EMG clinic ([phone]) and let us know.  You can apply ice to the painful areas for relief - Do not apply ice directly to skin but can wrap in washcloth/towel.    The final electrodiagnostic report will be ready soon, and we will forward the report to the referring physician. Please ensure that you follow up with the referring provider or with your primary care physician.

## 2020-05-03 NOTE — Procedures (Signed)
[facility]  Electrodiagnostic Laboratory  Department of Physical Medicine & Rehabilitation [address] [place], [place] [address]  Phone: [phone]  Fax: [phone]     Test Date:  05/03/2020  ID: [id number]  Name: Catherine, [name]  DOB: 1971-04-30  Date of Exam: 05/03/2020    REQUESTING PHYSICIAN: Dr. Raynald Kemp    REASONS FOR CONSULTATION AND ELECTRODIAGNOSTIC STUDY:  49 y/o female with PMH of chiari malformation and cervical syrinx, presenting for LLE radiculopathy screen.    CLINICAL HISTORY:  49 y/o female with PMH of chiari malformation and cervical syrinx, who presents with lower back pain radiation down the LLE to the bottom of the left foot.  Denies any weakness.     PHYSICAL EXAMINATION:  VITALS: Wt 121.5 BP 114/63 HR 69 RR 12 T 97.0  CONST: Patient alert and affect stable, cooperative with history and exam  PSYCH: mood is appropriate, pain behaviors were not present  CARDIOVASC: pulse intact, no significant limb edema  RESP: non-labored and non-dyspneic breathing  SKIN: no skin breakdown or signs of infection in the regions of interest for electrodiagnostic studies  MUSCULOSKELETAL: no significant limb contractures, no limb atrophy  NEURO:  BLE strength 5/5  Sensation intact to light touch throughout BLE.     Details of the electrodiagnostic study procedure, rationale for the referral, potential side effects/complications were discussed with the patient.  The patient expressed understanding and is agreeable to proceed with the study.    CLINICAL IMPRESSION:  Abnormal Study    1. Electrodiagnostic evidence of a chronic LEFT L5-S1 motor radiculopathy.     Needle EMG showed evidence of large amplitude motor units with polyphasicity.  There was no evidence of an ongoing or significant active radicular process at this time.     Lumbar paraspinals did not show increased insertional activity and no abnormal spontaneous potentials (no fibrillation or positive sharp waves).    2. No  evidence of a peripheral neuropathy in the left lower extremity.  Sural sensory study was normal.    Peroneal motor study showed borderline low amplitude responses but still fell within normal limits.    Electronically signed by:  Toy Care, PA-C    I personally participated in the examination and development of the impression, reviewed the record and the PAs interpretation, and I agree with the documentation.    Electronically signed by:  Markham Jordan, MD   Faculty Attending    Nerve Conduction Studies  Anti Sensory Summary Table     Stim Site NR Onset (ms) Peak (ms) Norm Peak (ms) O-P* Amp (V) Norm O-P Amp Site1 Site2 Delta-0 (ms) Dist (cm) Vel (m/s) Norm Vel (m/s)   Left Sural Anti Sensory (Lat Mall)   Calf    2.5 2.9 <4.0 14.3 >5.0 Calf Lat Mall 2.5 14.0 56 >35     Motor Summary Table     Stim Site NR Onset (ms) Norm Onset (ms) O-P Amp (mV) Norm O-P Amp Neg Area (mVms) Site1 Site2 Delta-0 (ms) Dist (cm) Vel (m/s) Norm Vel (m/s)   Left Peroneal Motor (Ext Dig Brev)   Ankle    4.0 <6.1 3.7 >2.5 12.40 B Fib Ankle 5.3 27.0 51 >38   B Fib    9.3  3.5  13.26 Poplt B Fib 1.4 10.0 71 >40   Poplt    10.7  3.3  10.95           EMG  Side Muscle Nerve Root Ins Act Fibs Psw Amp Dur Poly Recrt Fas Int Pat Comment   Left VastusMed Femoral L2-4 Nml Nml Nml Nml Nml Nml Nml  Nml    Left AntTibialis Dp Br Peron L4-5 Nml Nml Nml Nml Nml Few Nml  Nml    Left BicepsFemS Sciatic L5-S1 Nml Nml Nml Nml Nml Nml Nml  Nml    Left Peroneus Long Sup Br Peron L5-S1 Nml Nml Nml Incr Nml Nml Nml  Nml    Left GluteusMed SupGluteal L5-S1 Nml Nml Nml Nml Nml Few Nml  Nml    Left Gastroc Tibial S1-2 Nml Nml Nml Incr Incr Few Nml  Nml    Left Lumbo Parasp Up Rami L1-2 Nml Nml Nml          Left Lumbo Parasp Mid Rami L3-4 Nml Nml Nml          Left Lumbo Parasp Low Rami L5-S1 Nml Nml Nml          Waveforms:

## 2020-05-10 ENCOUNTER — Ambulatory Visit: Payer: No Typology Code available for payment source

## 2020-05-12 ENCOUNTER — Ambulatory Visit: Payer: No Typology Code available for payment source | Admitting: Neurological Surgery

## 2020-06-09 ENCOUNTER — Ambulatory Visit: Payer: No Typology Code available for payment source | Attending: Neurological Surgery | Admitting: Neurological Surgery

## 2020-06-09 ENCOUNTER — Encounter: Payer: Self-pay | Admitting: Neurological Surgery

## 2020-06-09 VITALS — BP 140/64 | HR 83 | Temp 97.2°F | Resp 16 | Ht 62.0 in | Wt 122.0 lb

## 2020-06-09 DIAGNOSIS — Z6822 Body mass index (BMI) 22.0-22.9, adult: Secondary | ICD-10-CM

## 2020-06-09 DIAGNOSIS — G935 Compression of brain: Secondary | ICD-10-CM

## 2020-06-09 MED ORDER — BACLOFEN 10 MG OR TABS
2.0000 | ORAL_TABLET | Freq: Every evening | ORAL | Status: AC
Start: 2020-05-18 — End: ?

## 2020-06-09 MED ORDER — B-COMPLEX-C PO: 1.0000 | Freq: Every day | ORAL | Status: AC

## 2020-06-09 NOTE — Progress Notes (Signed)
Dr. Beryle Flock  [facility]  Dept of Neurological Surgery  [address] [place], [address], [address]  [place], [place] [address]  P: [phone]  F: [phone]    Neurological Surgery Clinic Follow-Up Note    Date of Evaluation: 06/09/2020   Referring Physician: Self, Referred  Primary Care Physician: Alonna Minium    Chief Complaint and History of Present Illness:   [name] is a 49 year old female seen for evaluation of a Chiari malformation with cervical syrinx s/p decompression first in 2017 and then again on 10/02/2019 (revision suboccipital craniectomy, c1 laminectomy, partial c2 laminectomy, duraplasty). The patient was previously seen on 12/03/2019 and presents today for followup evaluation.    Currently, [name] complaints of LLE radiculopathy. She states that her LLE pain has been getting worse and awakens her at night. She was recently started on Baclofen 10mg  by her PCP, which she states help[s her pain at night and allows her to sleep better. She also reports LUE weakness and bilateral UE numbness and tingling. Also reports headaches tension headaches 2-3x a month that improves with sleep. No photophobia or n/v during these episodes. Denies any urinary or bowel incontinence, falls, or n/v. Denies any neck fullness or neck pain    She also reports that she swims often and it helps with the pain in the morning, but her pain comes back at night. She also reports that she has had low back injections in the past that caused her severe muscle cramps after.    Past Medical History:  No past medical history on file.    Past Surgical History:   No past surgical history on file.    Social History:  Social History     Tobacco Use   . Smoking status: Never Smoker   . Smokeless tobacco: Never Used   Substance Use Topics   . Alcohol use: Never   . Drug use: Not on file       Family History:  No family history on file.      Allergies:   Allergies   Allergen Reactions   . Tylenol  [Acetaminophen] Rash and Itching       Current Medications:  Outpatient Encounter Medications as of 06/09/2020   Medication Sig Dispense Refill   . Cholecalciferol (VITAMIN D3) 125 MCG (5000 UT) capsule        Facility-Administered Encounter Medications as of 06/09/2020   Medication Dose Route Frequency Provider Last Rate Last Admin   . perflutren lipid microspheres (DEFINITY) injection 0.44 mg  0.4 mL IntraVENOUS Once Until Completed 06/11/2020, MD           Review of Systems:  10 point review of systems were asked and patient denies: Headaches, blurred vision, syncope/dizziness, muscle weakness, chest pain, shortness of breath, nausea, abdominal pain, blood per rectum, nor bowel or bladder dysfunction.     Physical Exam:  There were no vitals taken for this visit.    General: No acute distress  HEENT: Normocephalic, atraumatic  Neck: supple  CV: normal rate  Lungs: No labored respirations  Abdomen: Non-distended   Extremities: Warm and well perfused. Left hand atrophic     Awake, alert, oriented, speech fluent  PERRL, EOMI, facial sensation equal bilaterally, face symetric at rest and on excursion, hearing grossly intact, voice normal, shoulder shrug equal, tongue midline  5/5 strength bilaterally shoulder abduction, elbow extension, elbow flexion, grip, hip  flexion, knee extension, knee flexion, dorsiflexion, plantar flexion; 4/5 LUE wrist extension, wrist flexion, thumb abduction.  No pronator drift  Sensation intact to light touch throughout  Reflexes 2+ bilaterally biceps, brachioradialis, patella  No Babinski sign; no Hoffman  Intact FTN testing    Back: no midline tenderness, stepoffs. L>R paraspinal TTP.    Labs: No new from previous visit     Diagnostic Imaging:   EMG 05/03/20  CLINICAL IMPRESSION:  Abnormal Study    1. Electrodiagnostic evidence of a chronic LEFT L5-S1 motor radiculopathy.    ? Needle EMG showed evidence of large amplitude motor units with polyphasicity.  There was no evidence of an  ongoing or significant active radicular process at this time.    ? Lumbar paraspinals did not show increased insertional activity and no abnormal spontaneous potentials (no fibrillation or positive sharp waves).    2. No evidence of a peripheral neuropathy in the left lower extremity.  Sural sensory study was normal.    Peroneal motor study showed borderline low amplitude responses but still fell within normal limits.  (interpretation from independent review of images).     ASSESSMENT/PLAN:  [name] is a 49 year old female with a chiari malformation s/p decompression who presents with worsening of her chronic LLE radiculopathy symptoms.  - Discussed stimulator implantation and patient wants to try conservative treatment first  - Patient to follow-up PRN.      FOLLOWUP: Patient to RTC as needed.    All questions were answered to the patient's and family's satisfaction. she was encouraged to contact our office should any concerns or questions arise. Our contact numbers were given to the patient.    This patient was seen and discussed with Attending Neurosurgeon, Dr. Beryle Flock.      Ashley Jacobs, MS3       Shared visit: Combined, we spent 30 minutes on providing care to this patient on the day of this ambulatory service, reviewing diagnostic tests and history, performing/updating history and physical examination, speaking with other providers and allied health professionals, counseling the patient, and documenting in the EMR. Any time spent teaching or on procedures is not counted in this total.
